# Patient Record
Sex: Female | Born: 1970 | Hispanic: No | Marital: Married | Smoking: Never smoker
Health system: Southern US, Community
[De-identification: ages and names within clinical notes are randomized; demographics above are authoritative.]

## PROBLEM LIST (undated history)

## (undated) ENCOUNTER — Inpatient Hospital Stay (HOSPITAL_COMMUNITY): Payer: Self-pay

## (undated) DIAGNOSIS — Z789 Other specified health status: Secondary | ICD-10-CM

## (undated) HISTORY — PX: NO PAST SURGERIES: SHX2092

---

## 2010-10-23 ENCOUNTER — Inpatient Hospital Stay (HOSPITAL_COMMUNITY)
Admission: AD | Admit: 2010-10-23 | Discharge: 2010-10-23 | Payer: Self-pay | Source: Home / Self Care | Attending: Obstetrics & Gynecology | Admitting: Obstetrics & Gynecology

## 2010-10-29 ENCOUNTER — Other Ambulatory Visit
Admission: RE | Admit: 2010-10-29 | Discharge: 2010-10-29 | Payer: Self-pay | Source: Home / Self Care | Admitting: Obstetrics and Gynecology

## 2010-10-29 ENCOUNTER — Encounter: Payer: Self-pay | Admitting: Obstetrics and Gynecology

## 2010-10-29 ENCOUNTER — Ambulatory Visit: Payer: Self-pay | Admitting: Physician Assistant

## 2010-10-29 LAB — CONVERTED CEMR LAB
Antibody Screen: NEGATIVE
Basophils Absolute: 0 10*3/uL (ref 0.0–0.1)
Basophils Relative: 0 % (ref 0–1)
Eosinophils Absolute: 0.3 10*3/uL (ref 0.0–0.7)
Eosinophils Relative: 4 % (ref 0–5)
HCT: 33.2 % — ABNORMAL LOW (ref 36.0–46.0)
HIV: NONREACTIVE
Hemoglobin: 11.3 g/dL — ABNORMAL LOW (ref 12.0–15.0)
Hepatitis B Surface Ag: NEGATIVE
Lymphocytes Relative: 12 % (ref 12–46)
Lymphs Abs: 0.8 10*3/uL (ref 0.7–4.0)
MCHC: 34 g/dL (ref 30.0–36.0)
MCV: 89.2 fL (ref 78.0–100.0)
Monocytes Absolute: 0.7 10*3/uL (ref 0.1–1.0)
Monocytes Relative: 10 % (ref 3–12)
Neutro Abs: 5.1 10*3/uL (ref 1.7–7.7)
Neutrophils Relative %: 74 % (ref 43–77)
Platelets: 277 10*3/uL (ref 150–400)
RBC: 3.72 M/uL — ABNORMAL LOW (ref 3.87–5.11)
RDW: 12.2 % (ref 11.5–15.5)
Rh Type: POSITIVE
Rubella: 253.3 intl units/mL — ABNORMAL HIGH
WBC: 6.9 10*3/uL (ref 4.0–10.5)

## 2010-11-12 ENCOUNTER — Ambulatory Visit
Admission: RE | Admit: 2010-11-12 | Discharge: 2010-11-12 | Payer: Self-pay | Source: Home / Self Care | Attending: Obstetrics and Gynecology | Admitting: Obstetrics and Gynecology

## 2010-11-12 LAB — POCT URINALYSIS DIPSTICK
Bilirubin Urine: NEGATIVE
Hemoglobin, Urine: NEGATIVE
Ketones, ur: NEGATIVE mg/dL
Nitrite: NEGATIVE
Protein, ur: NEGATIVE mg/dL
Specific Gravity, Urine: 1.02 (ref 1.005–1.030)
Urine Glucose, Fasting: NEGATIVE mg/dL
Urobilinogen, UA: 0.2 mg/dL (ref 0.0–1.0)
pH: 7.5 (ref 5.0–8.0)

## 2010-11-26 ENCOUNTER — Encounter: Payer: Self-pay | Admitting: Obstetrics & Gynecology

## 2010-11-26 ENCOUNTER — Ambulatory Visit
Admission: RE | Admit: 2010-11-26 | Discharge: 2010-11-26 | Payer: Self-pay | Source: Home / Self Care | Attending: Obstetrics and Gynecology | Admitting: Obstetrics and Gynecology

## 2010-12-01 LAB — POCT URINALYSIS DIPSTICK
Bilirubin Urine: NEGATIVE
Hgb urine dipstick: NEGATIVE
Ketones, ur: NEGATIVE mg/dL
Nitrite: NEGATIVE
Protein, ur: NEGATIVE mg/dL
Specific Gravity, Urine: 1.01 (ref 1.005–1.030)
Urine Glucose, Fasting: NEGATIVE mg/dL
Urobilinogen, UA: 0.2 mg/dL (ref 0.0–1.0)
pH: 6 (ref 5.0–8.0)

## 2010-12-17 ENCOUNTER — Other Ambulatory Visit: Payer: Self-pay

## 2010-12-17 DIAGNOSIS — O0930 Supervision of pregnancy with insufficient antenatal care, unspecified trimester: Secondary | ICD-10-CM

## 2010-12-17 LAB — POCT URINALYSIS DIPSTICK
Bilirubin Urine: NEGATIVE
Hgb urine dipstick: NEGATIVE
Ketones, ur: NEGATIVE mg/dL
Nitrite: NEGATIVE
Protein, ur: NEGATIVE mg/dL
Specific Gravity, Urine: 1.01 (ref 1.005–1.030)
Urine Glucose, Fasting: NEGATIVE mg/dL
Urobilinogen, UA: 0.2 mg/dL (ref 0.0–1.0)
pH: 7 (ref 5.0–8.0)

## 2010-12-31 ENCOUNTER — Encounter: Payer: Self-pay | Admitting: Physician Assistant

## 2010-12-31 ENCOUNTER — Other Ambulatory Visit: Payer: Self-pay

## 2010-12-31 DIAGNOSIS — O0930 Supervision of pregnancy with insufficient antenatal care, unspecified trimester: Secondary | ICD-10-CM

## 2010-12-31 LAB — POCT URINALYSIS DIPSTICK
Bilirubin Urine: NEGATIVE
Hgb urine dipstick: NEGATIVE
Ketones, ur: NEGATIVE mg/dL
Nitrite: NEGATIVE
Protein, ur: 30 mg/dL — AB
Specific Gravity, Urine: >=1.03 (ref 1.005–1.030)
Urine Glucose, Fasting: NEGATIVE mg/dL
Urobilinogen, UA: 0.2 mg/dL (ref 0.0–1.0)
pH: 6 (ref 5.0–8.0)

## 2011-01-01 ENCOUNTER — Encounter: Payer: Self-pay | Admitting: Physician Assistant

## 2011-01-07 ENCOUNTER — Other Ambulatory Visit: Payer: Self-pay

## 2011-01-07 ENCOUNTER — Encounter: Payer: Self-pay | Admitting: Obstetrics and Gynecology

## 2011-01-07 DIAGNOSIS — O0930 Supervision of pregnancy with insufficient antenatal care, unspecified trimester: Secondary | ICD-10-CM

## 2011-01-07 LAB — CONVERTED CEMR LAB
Chlamydia, Swab/Urine, PCR: NEGATIVE
GC Probe Amp, Urine: NEGATIVE

## 2011-01-07 LAB — POCT URINALYSIS DIPSTICK
Bilirubin Urine: NEGATIVE
Hgb urine dipstick: NEGATIVE
Ketones, ur: NEGATIVE mg/dL
Nitrite: NEGATIVE
Protein, ur: 30 mg/dL — AB
Specific Gravity, Urine: 1.025 (ref 1.005–1.030)
Urine Glucose, Fasting: NEGATIVE mg/dL
Urobilinogen, UA: 0.2 mg/dL (ref 0.0–1.0)
pH: 6 (ref 5.0–8.0)

## 2011-01-14 ENCOUNTER — Other Ambulatory Visit: Payer: Self-pay

## 2011-01-14 DIAGNOSIS — O0930 Supervision of pregnancy with insufficient antenatal care, unspecified trimester: Secondary | ICD-10-CM

## 2011-01-14 LAB — POCT URINALYSIS DIPSTICK
Bilirubin Urine: NEGATIVE
Glucose, UA: NEGATIVE mg/dL
Hgb urine dipstick: NEGATIVE
Ketones, ur: NEGATIVE mg/dL
Nitrite: NEGATIVE
Protein, ur: NEGATIVE mg/dL
Specific Gravity, Urine: 1.015 (ref 1.005–1.030)
Urobilinogen, UA: 0.2 mg/dL (ref 0.0–1.0)
pH: 7 (ref 5.0–8.0)

## 2011-01-19 LAB — CBC
HCT: 33.9 % — ABNORMAL LOW (ref 36.0–46.0)
Hemoglobin: 11.8 g/dL — ABNORMAL LOW (ref 12.0–15.0)
MCH: 30.6 pg (ref 26.0–34.0)
MCHC: 34.8 g/dL (ref 30.0–36.0)
MCV: 87.8 fL (ref 78.0–100.0)
Platelets: 271 10*3/uL (ref 150–400)
RBC: 3.86 MIL/uL — ABNORMAL LOW (ref 3.87–5.11)
RDW: 12.1 % (ref 11.5–15.5)
WBC: 7.2 10*3/uL (ref 4.0–10.5)

## 2011-01-19 LAB — URINALYSIS, ROUTINE W REFLEX MICROSCOPIC
Bilirubin Urine: NEGATIVE
Glucose, UA: NEGATIVE mg/dL
Hgb urine dipstick: NEGATIVE
Ketones, ur: NEGATIVE mg/dL
Leukocytes, UA: NEGATIVE
Nitrite: POSITIVE — AB
Protein, ur: NEGATIVE mg/dL
Specific Gravity, Urine: 1.01 (ref 1.005–1.030)
Urobilinogen, UA: 0.2 mg/dL (ref 0.0–1.0)
pH: 7 (ref 5.0–8.0)

## 2011-01-19 LAB — POCT URINALYSIS DIPSTICK
Bilirubin Urine: NEGATIVE
Glucose, UA: NEGATIVE mg/dL
Hgb urine dipstick: NEGATIVE
Ketones, ur: NEGATIVE mg/dL
Nitrite: NEGATIVE
Protein, ur: NEGATIVE mg/dL
Specific Gravity, Urine: 1.015 (ref 1.005–1.030)
Urobilinogen, UA: 0.2 mg/dL (ref 0.0–1.0)
pH: 7 (ref 5.0–8.0)

## 2011-01-19 LAB — URINE CULTURE
Colony Count: >=100000
Culture  Setup Time: 201112151612

## 2011-01-19 LAB — WET PREP, GENITAL
Clue Cells Wet Prep HPF POC: NONE SEEN
Trich, Wet Prep: NONE SEEN
Yeast Wet Prep HPF POC: NONE SEEN

## 2011-01-19 LAB — RPR: RPR Ser Ql: NONREACTIVE

## 2011-01-19 LAB — ABO/RH: ABO/RH(D): O POS

## 2011-01-19 LAB — URINE MICROSCOPIC-ADD ON

## 2011-01-19 LAB — GC/CHLAMYDIA PROBE AMP, GENITAL
Chlamydia, DNA Probe: NEGATIVE
GC Probe Amp, Genital: NEGATIVE

## 2011-01-21 ENCOUNTER — Other Ambulatory Visit: Payer: Self-pay

## 2011-01-21 DIAGNOSIS — O0930 Supervision of pregnancy with insufficient antenatal care, unspecified trimester: Secondary | ICD-10-CM

## 2011-01-21 LAB — POCT URINALYSIS DIPSTICK
Bilirubin Urine: NEGATIVE
Glucose, UA: NEGATIVE mg/dL
Hgb urine dipstick: NEGATIVE
Ketones, ur: NEGATIVE mg/dL
Nitrite: NEGATIVE
Protein, ur: NEGATIVE mg/dL
Specific Gravity, Urine: 1.02 (ref 1.005–1.030)
Urobilinogen, UA: 0.2 mg/dL (ref 0.0–1.0)
pH: 6.5 (ref 5.0–8.0)

## 2011-01-25 ENCOUNTER — Inpatient Hospital Stay (HOSPITAL_COMMUNITY)
Admission: AD | Admit: 2011-01-25 | Discharge: 2011-01-25 | Disposition: A | Discharge status: Home / Self Care | Payer: Medicaid Other | Source: Ambulatory Visit | Attending: Obstetrics & Gynecology | Admitting: Obstetrics & Gynecology

## 2011-01-25 DIAGNOSIS — O479 False labor, unspecified: Secondary | ICD-10-CM | POA: Insufficient documentation

## 2011-01-26 ENCOUNTER — Inpatient Hospital Stay (HOSPITAL_COMMUNITY)
Admission: AD | Admit: 2011-01-26 | Discharge: 2011-01-29 | DRG: 775 | Disposition: A | Discharge status: Home / Self Care | Payer: Medicaid Other | Source: Ambulatory Visit | Attending: Family Medicine | Admitting: Family Medicine

## 2011-01-26 DIAGNOSIS — O09529 Supervision of elderly multigravida, unspecified trimester: Secondary | ICD-10-CM | POA: Diagnosis present

## 2011-01-26 LAB — CBC
HCT: 39.5 % (ref 36.0–46.0)
Hemoglobin: 13.5 g/dL (ref 12.0–15.0)
MCH: 30.2 pg (ref 26.0–34.0)
MCHC: 34.2 g/dL (ref 30.0–36.0)
MCV: 88.4 fL (ref 78.0–100.0)
Platelets: 292 10*3/uL (ref 150–400)
RBC: 4.47 MIL/uL (ref 3.87–5.11)
RDW: 13.1 % (ref 11.5–15.5)
WBC: 11.8 10*3/uL — ABNORMAL HIGH (ref 4.0–10.5)

## 2011-01-27 DIAGNOSIS — O09529 Supervision of elderly multigravida, unspecified trimester: Secondary | ICD-10-CM

## 2011-01-27 LAB — COMPREHENSIVE METABOLIC PANEL
ALT: 27 U/L (ref 0–35)
AST: 30 U/L (ref 0–37)
Albumin: 2.7 g/dL — ABNORMAL LOW (ref 3.5–5.2)
Alkaline Phosphatase: 256 U/L — ABNORMAL HIGH (ref 39–117)
BUN: 8 mg/dL (ref 6–23)
CO2: 20 mEq/L (ref 19–32)
Calcium: 8.8 mg/dL (ref 8.4–10.5)
Chloride: 101 mEq/L (ref 96–112)
Creatinine, Ser: 0.64 mg/dL (ref 0.4–1.2)
GFR calc Af Amer: >60 mL/min (ref >60)
GFR calc non Af Amer: >60 mL/min (ref >60)
Glucose, Bld: 92 mg/dL (ref 70–99)
Potassium: 3.9 mEq/L (ref 3.5–5.1)
Sodium: 132 mEq/L — ABNORMAL LOW (ref 135–145)
Total Bilirubin: 1 mg/dL (ref 0.3–1.2)
Total Protein: 6.5 g/dL (ref 6.0–8.3)

## 2011-01-27 LAB — RPR: RPR Ser Ql: NONREACTIVE

## 2012-05-05 ENCOUNTER — Emergency Department (HOSPITAL_COMMUNITY)
Admission: EM | Admit: 2012-05-05 | Discharge: 2012-05-05 | Disposition: A | Discharge status: Home / Self Care | Payer: BC Managed Care – PPO | Source: Home / Self Care | Attending: Emergency Medicine | Admitting: Emergency Medicine

## 2012-05-05 ENCOUNTER — Encounter (HOSPITAL_COMMUNITY): Payer: Self-pay | Admitting: *Deleted

## 2012-05-05 ENCOUNTER — Inpatient Hospital Stay (HOSPITAL_COMMUNITY)
Admission: AD | Admit: 2012-05-05 | Discharge: 2012-05-06 | Disposition: A | Discharge status: Home / Self Care | Payer: BC Managed Care – PPO | Source: Ambulatory Visit | Attending: Family Medicine | Admitting: Family Medicine

## 2012-05-05 ENCOUNTER — Inpatient Hospital Stay (HOSPITAL_COMMUNITY): Payer: BC Managed Care – PPO

## 2012-05-05 ENCOUNTER — Encounter (HOSPITAL_COMMUNITY): Payer: Self-pay | Admitting: Emergency Medicine

## 2012-05-05 DIAGNOSIS — O2340 Unspecified infection of urinary tract in pregnancy, unspecified trimester: Secondary | ICD-10-CM

## 2012-05-05 DIAGNOSIS — O4690 Antepartum hemorrhage, unspecified, unspecified trimester: Secondary | ICD-10-CM

## 2012-05-05 DIAGNOSIS — O2390 Unspecified genitourinary tract infection in pregnancy, unspecified trimester: Secondary | ICD-10-CM | POA: Insufficient documentation

## 2012-05-05 DIAGNOSIS — O200 Threatened abortion: Secondary | ICD-10-CM | POA: Insufficient documentation

## 2012-05-05 DIAGNOSIS — O26849 Uterine size-date discrepancy, unspecified trimester: Secondary | ICD-10-CM | POA: Insufficient documentation

## 2012-05-05 DIAGNOSIS — N390 Urinary tract infection, site not specified: Secondary | ICD-10-CM | POA: Insufficient documentation

## 2012-05-05 HISTORY — DX: Other specified health status: Z78.9

## 2012-05-05 LAB — POCT URINALYSIS DIP (DEVICE)
Glucose, UA: 250 mg/dL — AB
Ketones, ur: 40 mg/dL — AB
Nitrite: POSITIVE — AB
Protein, ur: >=300 mg/dL — AB
Specific Gravity, Urine: 1.015 (ref 1.005–1.030)
Urobilinogen, UA: >=8 mg/dL (ref 0.0–1.0)
pH: 8.5 — ABNORMAL HIGH (ref 5.0–8.0)

## 2012-05-05 LAB — CBC
Hemoglobin: 11.2 g/dL — ABNORMAL LOW (ref 12.0–15.0)
MCHC: 34.3 g/dL (ref 30.0–36.0)
RDW: 12.4 % (ref 11.5–15.5)

## 2012-05-05 LAB — OB RESULTS CONSOLE GC/CHLAMYDIA
Chlamydia: NEGATIVE
Gonorrhea: NEGATIVE

## 2012-05-05 LAB — POCT PREGNANCY, URINE: Preg Test, Ur: POSITIVE — AB

## 2012-05-05 LAB — HCG, QUANTITATIVE, PREGNANCY: hCG, Beta Chain, Quant, S: 9348 m[IU]/mL — ABNORMAL HIGH (ref <5)

## 2012-05-05 NOTE — MAU Note (Signed)
Pt +UPT at Urgent Care started bleeding on Monday.  Bleeding heavier today and passing clots. Lower abd pain since Sunday.

## 2012-05-05 NOTE — Progress Notes (Signed)
Speculum exam done by Ivonne Andrew CNM small amount of  Bleeding noted and cervix 1cm long

## 2012-05-05 NOTE — MAU Note (Signed)
Pt states she has been bleeding since Sunday 

## 2012-05-05 NOTE — MAU Provider Note (Signed)
Erin Kpa41 y.o.G2P0 @[redacted]w[redacted]d  by LMP Chief Complaint  Patient presents with  . Vaginal Bleeding  . Abdominal Pain     First Provider Initiated Contact with Patient 05/05/12 2058      SUBJECTIVE  HPI: HPI: Erin Buckley is a 41 y.o. year old G66P0 female at [redacted]w[redacted]d weeks gestation by LMP who presents to MAU reporting vaginal bleeding with clots x 4 days and mild cramping. Denies passage of tissue. Seen at Urgent Care this evening and sent to MAU in private vehicle. Urinalysis was nitrate positive at urgent care. She states she has not had any ultrasounds in this pregnancy. Blood type O pos.  Past Medical History  Diagnosis Date  . No pertinent past medical history    Past Surgical History  Procedure Date  . No past surgeries    History   Social History  . Marital Status: Married    Spouse Name: N/A    Number of Children: N/A  . Years of Education: N/A   Occupational History  . Not on file.   Social History Main Topics  . Smoking status: Never Smoker   . Smokeless tobacco: Not on file  . Alcohol Use: No  . Drug Use: No  . Sexually Active:    Other Topics Concern  . Not on file   Social History Narrative  . No narrative on file   No current facility-administered medications on file prior to encounter.   No current outpatient prescriptions on file prior to encounter.   No Known Allergies  ROS: Pertinent items in HPI  OBJECTIVE Blood pressure 108/80, pulse 92, temperature 98.5 F (36.9 C), temperature source Oral, resp. rate 16, height 5\' 2"  (1.575 m), weight 52.527 kg (115 lb 12.8 oz), last menstrual period 02/23/2012.  GENERAL: Well-developed, slender Asian female in no acute distress.  HEENT: Normocephalic HEART: normal rate RESP: normal effort ABDOMEN: Soft, nontender EXTREMITIES: Nontender, no edema NEURO: Alert and oriented SPECULUM EXAM: NEFG, small amount of bright red blood noted, cervix clean BIMANUAL: cervix 1 cm and long; uterus slightly enlarged,  nontender; no adnexal tenderness or masses  LAB RESULTS  Results for orders placed during the hospital encounter of 05/05/12 (from the past 24 hour(s))  HCG, QUANTITATIVE, PREGNANCY     Status: Abnormal   Collection Time   05/05/12  8:58 PM      Component Value Range   hCG, Beta Chain, Quant, S 9348 (*) <5 mIU/mL  CBC     Status: Abnormal   Collection Time   05/05/12  8:58 PM      Component Value Range   WBC 8.2  4.0 - 10.5 K/uL   RBC 3.73 (*) 3.87 - 5.11 MIL/uL   Hemoglobin 11.2 (*) 12.0 - 15.0 g/dL   HCT 16.1 (*) 09.6 - 04.5 %   MCV 87.7  78.0 - 100.0 fL   MCH 30.0  26.0 - 34.0 pg   MCHC 34.3  30.0 - 36.0 g/dL   RDW 40.9  81.1 - 91.4 %   Platelets 283  150 - 400 K/uL  WET PREP, GENITAL     Status: Abnormal   Collection Time   05/05/12 11:50 PM      Component Value Range   Yeast Wet Prep HPF POC NONE SEEN  NONE SEEN   Trich, Wet Prep NONE SEEN  NONE SEEN   Clue Cells Wet Prep HPF POC NONE SEEN  NONE SEEN   WBC, Wet Prep HPF POC FEW (*) NONE SEEN  URINALYSIS, ROUTINE W REFLEX MICROSCOPIC     Status: Abnormal   Collection Time   05/05/12 11:50 PM      Component Value Range   Color, Urine YELLOW  YELLOW   APPearance CLEAR  CLEAR   Specific Gravity, Urine >1.030 (*) 1.005 - 1.030   pH 6.0  5.0 - 8.0   Glucose, UA NEGATIVE  NEGATIVE mg/dL   Hgb urine dipstick NEGATIVE  NEGATIVE   Bilirubin Urine NEGATIVE  NEGATIVE   Ketones, ur NEGATIVE  NEGATIVE mg/dL   Protein, ur NEGATIVE  NEGATIVE mg/dL   Urobilinogen, UA 0.2  0.0 - 1.0 mg/dL   Nitrite NEGATIVE  NEGATIVE   Leukocytes, UA NEGATIVE  NEGATIVE    IMAGING US Ob Comp Less 14 Wks  05/05/2012  *RADIOLOGY REPORT*  Clinical Data: Bleeding and pain.  Estimated gestational age by LMP is 10 weeks 2 days.  Quantitative beta HCG is 9348.  OBSTETRIC <14 WK [place] AND TRANSVAGINAL OB US  Technique:  Both transabdominal and transvaginal ultrasound examinations were performed for complete evaluation of the gestation as well as the maternal  uterus, adnexal regions, and pelvic cul-de-sac.  Transvaginal technique was performed to assess early pregnancy.  Comparison:  None.  Intrauterine gestational sac:  A single gestational sac is visualized.  The gestational sac is located in the vaginal region. Endometrial stripe thickness is normal and no intrauterine pregnancy is visualized.  Small amount of fluid in the lower uterine segment. Yolk sac: Present. Embryo: Present. Cardiac Activity: Not visualized. Heart Rate: N/A bpm  MSD: 12  mm CRL: 26   mm  5   w  6   d          [place] EDC: 12/30/2012  Maternal uterus/adnexae: The uterus is anteverted.  No focal myometrial masses. The right ovary measures 2.5 x 1.3 x 1.6 cm.  The left ovary measures 2 x 1.1 x 1.4 cm.  Normal follicular changes in both ovaries.  No abnormal adnexal masses.  Small amount of free fluid in the pelvis.  IMPRESSION: An intrauterine gestational sac, including fetal pole and yolk sac, are visualized in the vaginal region.  No fetal cardiac activity is visualized.  Estimated gestational age by crown-rump length is 5 weeks 6 days which is smaller than would be expected by reported last menstrual period.  Changes are consistent with ongoing spontaneous abortion.  Original Report Authenticated By: Erin Buckley, M.D.   US Ob Transvaginal  05/05/2012  *RADIOLOGY REPORT*  Clinical Data: Bleeding and pain.  Estimated gestational age by LMP is 10 weeks 2 days.  Quantitative beta HCG is 9348.  OBSTETRIC <14 WK [place] AND TRANSVAGINAL OB US  Technique:  Both transabdominal and transvaginal ultrasound examinations were performed for complete evaluation of the gestation as well as the maternal uterus, adnexal regions, and pelvic cul-de-sac.  Transvaginal technique was performed to assess early pregnancy.  Comparison:  None.  Intrauterine gestational sac:  A single gestational sac is visualized.  The gestational sac is located in the vaginal region. Endometrial stripe thickness is normal and no  intrauterine pregnancy is visualized.  Small amount of fluid in the lower uterine segment. Yolk sac: Present. Embryo: Present. Cardiac Activity: Not visualized. Heart Rate: N/A bpm  MSD: 12  mm CRL: 26   mm  5   w  6   d          [place] EDC: 12/30/2012  Maternal uterus/adnexae: The uterus is anteverted.  No focal myometrial masses.  The right ovary measures 2.5 x 1.3 x 1.6 cm.  The left ovary measures 2 x 1.1 x 1.4 cm.  Normal follicular changes in both ovaries.  No abnormal adnexal masses.  Small amount of free fluid in the pelvis.  IMPRESSION: An intrauterine gestational sac, including fetal pole and yolk sac, are visualized in the vaginal region.  No fetal cardiac activity is visualized.  Estimated gestational age by crown-rump length is 5 weeks 6 days which is smaller than would be expected by reported last menstrual period.  Changes are consistent with ongoing spontaneous abortion.  Original Report Authenticated By: Erin Buckley, M.D.   ASSESSMENT 1. UTI (urinary tract infection) during pregnancy   2. Threatened abortion   3. Uterine size date discrepancy pregnancy    PLAN D/C home SAB precautions Lengthy discussion via phone interpreter about the uncertainty of findings, but possible SAB in progress Repeat ultrasound in one week Urine culture pending Medication List  As of 05/06/2012  4:17 AM   TAKE these medications         nitrofurantoin (macrocrystal-monohydrate) 100 MG capsule   Commonly known as: MACROBID   Take 1 capsule (100 mg total) by mouth 2 (two) times daily.           Follow-up Information    Follow up with [facility] in 1 week. (or  as needed if symptoms worsen)    Contact information:   [address] [place] [address] [phone]        Dorathy Kinsman 05/05/2012 11:07 PM

## 2012-05-05 NOTE — ED Provider Notes (Addendum)
History     CSN: [id number]  Arrival date & time 05/05/12  1643   First MD Initiated Contact with Patient 05/05/12 1650      Chief Complaint  Patient presents with  . Vaginal Bleeding    (Consider location/radiation/quality/duration/timing/severity/associated sxs/prior treatment) HPI Comments: Patient presents urgent care today complaining of ongoing vaginal bleeding. Started spotting on Sunday but since Friday had been bleeding large amounts of blood. She describes her last normal. Was around May. She is sexually active and does not use any form of contraception. She describes a ongoing pain and discomfort in the lower abdominal region. Points mainly to right lower quadrant area. Patient denies any fevers, vomiting. Says the bleeding has been more severe today and she seem being clots of blood.  Patient is a 41 y.o. female presenting with vaginal bleeding. The history is provided by the patient.  Vaginal Bleeding This is a new problem. The current episode started more than 1 week ago. The problem occurs constantly. The problem has been gradually worsening. Associated symptoms include abdominal pain. Pertinent negatives include no headaches and no shortness of breath. Nothing relieves the symptoms. She has tried nothing for the symptoms.    History reviewed. No pertinent past medical history.  History reviewed. No pertinent past surgical history.  No family history on file.  History  Substance Use Topics  . Smoking status: Never Smoker   . Smokeless tobacco: Not on file  . Alcohol Use: No    OB History    Grav Para Term Preterm Abortions TAB SAB Ect Mult Living   1               Review of Systems  Constitutional: Positive for activity change. Negative for chills and appetite change.  Respiratory: Negative for shortness of breath.   Gastrointestinal: Positive for abdominal pain.  Genitourinary: Positive for vaginal bleeding and menstrual problem. Negative for vaginal  discharge and vaginal pain.  Neurological: Negative for headaches.    Allergies  Review of patient's allergies indicates no known allergies.  Home Medications  No current outpatient prescriptions on file.  BP 109/79  Pulse 85  Temp 98.4 F (36.9 C) (Oral)  Resp 18  SpO2 100%  LMP 02/23/2012  Physical Exam  Nursing note and vitals reviewed. Constitutional: Vital signs are normal. She appears well-developed and well-nourished.  Non-toxic appearance. She does not have a sickly appearance. She does not appear ill. No distress.  HENT:  Head: Normocephalic.  Eyes: Conjunctivae are normal. No scleral icterus.  Neck: Neck supple.  Cardiovascular: Normal rate.  Exam reveals no gallop and no friction rub.   No murmur heard. Pulmonary/Chest: Effort normal and breath sounds normal.  Abdominal: She exhibits no shifting dullness and no distension. There is no hepatosplenomegaly or hepatomegaly. There is no rebound, no guarding, no CVA tenderness, no tenderness at McBurney's point and negative Murphy's sign. No hernia. Hernia confirmed negative in the ventral area.    Genitourinary:     Musculoskeletal: Normal range of motion.  Neurological: She is alert.  Skin: Skin is warm.    ED Course  Procedures (including critical care time)  Labs Reviewed  POCT PREGNANCY, URINE - Abnormal; Notable for the following:    Preg Test, Ur POSITIVE (*)     All other components within normal limits  POCT URINALYSIS DIP (DEVICE) - Abnormal; Notable for the following:    Glucose, UA 250 (*)     Bilirubin Urine LARGE (*)     Ketones,  ur 40 (*)     Hgb urine dipstick LARGE (*)     pH 8.5 (*)     Protein, ur >=300 (*)     Nitrite POSITIVE (*)     Leukocytes, UA LARGE (*)  Biochemical Testing Only. Please order routine urinalysis from main lab if confirmatory testing is needed.   All other components within normal limits   No results found.   1. Vaginal bleeding in pregnancy       MDM    Vaginal bleeding with pelvic pain and pregnant. Based on LMP possibly 8-10 months. Have transfer patient to [facility] hospital had discussed case with nurse practitioner interpretation was used, patient and companion aware that they will be waiting for her in the women[facility] hospital at the MAU unit. Ectopic pregnancy versus active spontaneous abortion. Patient is hemodynamically stable, and is accompanied by her husband and we used a known friend as a Nurse, learning disability. They are preparing to go to women's hospital right now       Jimmie Molly, MD 05/05/12 1610  Jimmie Molly, MD 05/05/12 [phone]

## 2012-05-05 NOTE — ED Notes (Signed)
[facility] [employer] [facility] -Press [facility].  Report given.

## 2012-05-05 NOTE — ED Notes (Signed)
Started bleeding on Sunday, today describes large amounts of blood.  Last period in may. Now is the first time seeing physician

## 2012-05-06 DIAGNOSIS — O2390 Unspecified genitourinary tract infection in pregnancy, unspecified trimester: Secondary | ICD-10-CM

## 2012-05-06 DIAGNOSIS — N390 Urinary tract infection, site not specified: Secondary | ICD-10-CM

## 2012-05-06 LAB — URINALYSIS, ROUTINE W REFLEX MICROSCOPIC
Ketones, ur: NEGATIVE mg/dL
Leukocytes, UA: NEGATIVE
Nitrite: NEGATIVE
Protein, ur: NEGATIVE mg/dL

## 2012-05-06 LAB — WET PREP, GENITAL
Trich, Wet Prep: NONE SEEN
Yeast Wet Prep HPF POC: NONE SEEN

## 2012-05-06 MED ORDER — NITROFURANTOIN MONOHYD MACRO 100 MG PO CAPS: 100.0000 mg | ORAL_CAPSULE | Freq: Two times a day (BID) | ORAL | Status: AC

## 2012-05-06 NOTE — MAU Provider Note (Signed)
Chart reviewed and agree with management and plan.  

## 2012-05-07 LAB — URINE CULTURE: Colony Count: NO GROWTH

## 2012-05-11 ENCOUNTER — Ambulatory Visit (HOSPITAL_COMMUNITY)
Admission: RE | Admit: 2012-05-11 | Discharge: 2012-05-11 | Disposition: A | Discharge status: Home / Self Care | Payer: BC Managed Care – PPO | Source: Ambulatory Visit | Attending: Advanced Practice Midwife | Admitting: Advanced Practice Midwife

## 2012-05-11 DIAGNOSIS — Z3689 Encounter for other specified antenatal screening: Secondary | ICD-10-CM | POA: Insufficient documentation

## 2012-05-11 DIAGNOSIS — O200 Threatened abortion: Secondary | ICD-10-CM | POA: Insufficient documentation

## 2012-11-09 NOTE — L&D Delivery Note (Signed)
Delivery Note At 9:38 AM a viable female was delivered via Vaginal, Spontaneous Delivery (Presentation: Right Occiput Anterior).  There was a shoulder dystocia for approximately 1.5 minutes. Maneuvers tried include McRoberts, suprapubic pressure, woodscrew maneuver rotating shoulders  counterclockwise from AP to oblique, and delivery of the posterior (left) arm achieved by hooking axilla. Dr. Marice Potter arrived in the room immediately after delivery. Infant vigorous by 1 minute of age. Moving both arms symmetrically.    APGAR: 8, 9; weight 7 lb 15.7 oz (3620 g).   Placenta status: Intact, Spontaneous.  Cord: 3 vessels with the following complications: None.  Cord pH: n/a  Anesthesia: Local  Episiotomy: None Lacerations: 2nd degree;Perineal Suture Repair: 2.0 3.0 vicryl rapide Est. Blood Loss (mL): 350cc  Mom to postpartum.  Baby to nursery-stable. Placenta to birthing suites.  Levert Feinstein 02/14/2013, 10:39 AM  The resident performed the delivery of the head. When turtle was observed, I performed the entire delivery and assisted with the repair. I agree with above.  [place], [place] 02/14/2013 4:09 PM

## 2012-11-09 NOTE — L&D Delivery Note (Signed)
Attestation of Attending Supervision of Advanced Practitioner (CNM/NP): Evaluation and management procedures were performed by the Advanced Practitioner under my supervision and collaboration.  I have reviewed the Advanced Practitioner's note and chart, and I agree with the management and plan.  Kazuki Ingle 02/21/2013 8:35 AM

## 2012-11-17 ENCOUNTER — Encounter: Payer: Self-pay | Admitting: Family Medicine

## 2012-11-17 ENCOUNTER — Ambulatory Visit (INDEPENDENT_AMBULATORY_CARE_PROVIDER_SITE_OTHER): Payer: BC Managed Care – PPO | Admitting: Family Medicine

## 2012-11-17 VITALS — BP 95/54 | Temp 97.6°F | Ht 62.25 in | Wt 137.8 lb

## 2012-11-17 DIAGNOSIS — Z789 Other specified health status: Secondary | ICD-10-CM | POA: Insufficient documentation

## 2012-11-17 DIAGNOSIS — O09529 Supervision of elderly multigravida, unspecified trimester: Secondary | ICD-10-CM

## 2012-11-17 DIAGNOSIS — Z23 Encounter for immunization: Secondary | ICD-10-CM

## 2012-11-17 DIAGNOSIS — O09299 Supervision of pregnancy with other poor reproductive or obstetric history, unspecified trimester: Secondary | ICD-10-CM

## 2012-11-17 DIAGNOSIS — Z609 Problem related to social environment, unspecified: Secondary | ICD-10-CM

## 2012-11-17 DIAGNOSIS — Z3480 Encounter for supervision of other normal pregnancy, unspecified trimester: Secondary | ICD-10-CM | POA: Insufficient documentation

## 2012-11-17 DIAGNOSIS — Z758 Other problems related to medical facilities and other health care: Secondary | ICD-10-CM

## 2012-11-17 DIAGNOSIS — IMO0002 Reserved for concepts with insufficient information to code with codable children: Secondary | ICD-10-CM | POA: Insufficient documentation

## 2012-11-17 DIAGNOSIS — O0930 Supervision of pregnancy with insufficient antenatal care, unspecified trimester: Secondary | ICD-10-CM

## 2012-11-17 LAB — DIFFERENTIAL
Basophils Relative: 0 % (ref 0–1)
Eosinophils Absolute: 0.3 10*3/uL (ref 0.0–0.7)
Eosinophils Relative: 3 % (ref 0–5)
Monocytes Relative: 8 % (ref 3–12)
Neutrophils Relative %: 75 % (ref 43–77)

## 2012-11-17 LAB — CBC
Hemoglobin: 10.7 g/dL — ABNORMAL LOW (ref 12.0–15.0)
MCH: 30.4 pg (ref 26.0–34.0)
MCHC: 33.8 g/dL (ref 30.0–36.0)

## 2012-11-17 MED ORDER — TETANUS-DIPHTH-ACELL PERTUSSIS 5-2.5-18.5 LF-MCG/0.5 IM SUSP
0.5000 mL | Freq: Once | INTRAMUSCULAR | Status: AC
  Administered 2012-11-17: 0.5 mL via INTRAMUSCULAR

## 2012-11-17 MED ORDER — INFLUENZA VIRUS VACC SPLIT PF IM SUSP
0.5000 mL | Freq: Once | INTRAMUSCULAR | Status: AC
  Administered 2012-11-17: 0.5 mL via INTRAMUSCULAR

## 2012-11-17 NOTE — Patient Instructions (Signed)
Pregnancy - Third Trimester The third trimester of pregnancy (the last 3 months) is a period of the most rapid growth for you and your baby. The baby approaches a length of 20 inches and a weight of 6 to 10 pounds. The baby is adding on fat and getting ready for life outside your body. While inside, babies have periods of sleeping and waking, suck their thumbs, and hiccups. You can often feel small contractions of the uterus. This is false labor. It is also called Braxton-Hicks contractions. This is like a practice for labor. The usual problems in this stage of pregnancy include more difficulty breathing, swelling of the hands and feet from water retention, and having to urinate more often because of the uterus and baby pressing on your bladder.  PRENATAL EXAMS  Blood work may continue to be done during prenatal exams. These tests are done to check on your health and the probable health of your baby. Blood work is used to follow your blood levels (hemoglobin). Anemia (low hemoglobin) is common during pregnancy. Iron and vitamins are given to help prevent this. You may also continue to be checked for diabetes. Some of the past blood tests may be done again.  The size of the uterus is measured during each visit. This makes sure your baby is growing properly according to your pregnancy dates.  Your blood pressure is checked every prenatal visit. This is to make sure you are not getting toxemia.  Your urine is checked every prenatal visit for infection, diabetes and protein.  Your weight is checked at each visit. This is done to make sure gains are happening at the suggested rate and that you and your baby are growing normally.  Sometimes, an ultrasound is performed to confirm the position and the proper growth and development of the baby. This is a test done that bounces harmless sound waves off the baby so your caregiver can more accurately determine due dates.  Discuss the type of pain medication  and anesthesia you will have during your labor and delivery.  Discuss the possibility and anesthesia if a Cesarean Section might be necessary.  Inform your caregiver if there is any mental or physical violence at home. Sometimes, a specialized non-stress test, contraction stress test and biophysical profile are done to make sure the baby is not having a problem. Checking the amniotic fluid surrounding the baby is called an amniocentesis. The amniotic fluid is removed by sticking a needle into the belly (abdomen). This is sometimes done near the end of pregnancy if an early delivery is required. In this case, it is done to help make sure the baby's lungs are mature enough for the baby to live outside of the womb. If the lungs are not mature and it is unsafe to deliver the baby, an injection of cortisone medication is given to the mother 1 to 2 days before the delivery. This helps the baby's lungs mature and makes it safer to deliver the baby. CHANGES OCCURING IN THE THIRD TRIMESTER OF PREGNANCY Your body goes through many changes during pregnancy. They vary from person to person. Talk to your caregiver about changes you notice and are concerned about.  During the last trimester, you have probably had an increase in your appetite. It is normal to have cravings for certain foods. This varies from person to person and pregnancy to pregnancy.  You may begin to get stretch marks on your hips, abdomen, and breasts. These are normal changes in the body   during pregnancy. There are no exercises or medications to take which prevent this change.  Constipation may be treated with a stool softener or adding bulk to your diet. Drinking lots of fluids, fiber in vegetables, fruits, and whole grains are helpful.  Exercising is also helpful. If you have been very active up until your pregnancy, most of these activities can be continued during your pregnancy. If you have been less active, it is helpful to start an  exercise program such as walking. Consult your caregiver before starting exercise programs.  Avoid all smoking, alcohol, un-prescribed drugs, herbs and "street drugs" during your pregnancy. These chemicals affect the formation and growth of the baby. Avoid chemicals throughout the pregnancy to ensure the delivery of a healthy infant.  Backache, varicose veins and hemorrhoids may develop or get worse.  You will tire more easily in the third trimester, which is normal.  The baby's movements may be stronger and more often.  You may become short of breath easily.  Your belly button may stick out.  A yellow discharge may leak from your breasts called colostrum.  You may have a bloody mucus discharge. This usually occurs a few days to a week before labor begins. HOME CARE INSTRUCTIONS   Keep your caregiver's appointments. Follow your caregiver's instructions regarding medication use, exercise, and diet.  During pregnancy, you are providing food for you and your baby. Continue to eat regular, well-balanced meals. Choose foods such as meat, fish, milk and other low fat dairy products, vegetables, fruits, and whole-grain breads and cereals. Your caregiver will tell you of the ideal weight gain.  A physical sexual relationship may be continued throughout pregnancy if there are no other problems such as early (premature) leaking of amniotic fluid from the membranes, vaginal bleeding, or belly (abdominal) pain.  Exercise regularly if there are no restrictions. Check with your caregiver if you are unsure of the safety of your exercises. Greater weight gain will occur in the last 2 trimesters of pregnancy. Exercising helps:  Control your weight.  Get you in shape for labor and delivery.  You lose weight after you deliver.  Rest a lot with legs elevated, or as needed for leg cramps or low back pain.  Wear a good support or jogging bra for breast tenderness during pregnancy. This may help if worn  during sleep. Pads or tissues may be used in the bra if you are leaking colostrum.  Do not use hot tubs, steam rooms, or saunas.  Wear your seat belt when driving. This protects you and your baby if you are in an accident.  Avoid raw meat, cat litter boxes and soil used by cats. These carry germs that can cause birth defects in the baby.  It is easier to loose urine during pregnancy. Tightening up and strengthening the pelvic muscles will help with this problem. You can practice stopping your urination while you are going to the bathroom. These are the same muscles you need to strengthen. It is also the muscles you would use if you were trying to stop from passing gas. You can practice tightening these muscles up 10 times a set and repeating this about 3 times per day. Once you know what muscles to tighten up, do not perform these exercises during urination. It is more likely to cause an infection by backing up the urine.  Ask for help if you have financial, counseling or nutritional needs during pregnancy. Your caregiver will be able to offer counseling for these   needs as well as refer you for other special needs.  Make a list of emergency phone numbers and have them available.  Plan on getting help from family or friends when you go home from the hospital.  Make a trial run to the hospital.  Take prenatal classes with the father to understand, practice and ask questions about the labor and delivery.  Prepare the baby's room/nursery.  Do not travel out of the city unless it is absolutely necessary and with the advice of your caregiver.  Wear only low or no heal shoes to have better balance and prevent falling. MEDICATIONS AND DRUG USE IN PREGNANCY  Take prenatal vitamins as directed. The vitamin should contain 1 milligram of folic acid. Keep all vitamins out of reach of children. Only a couple vitamins or tablets containing iron may be fatal to a baby or young child when  ingested.  Avoid use of all medications, including herbs, over-the-counter medications, not prescribed or suggested by your caregiver. Only take over-the-counter or prescription medicines for pain, discomfort, or fever as directed by your caregiver. Do not use aspirin, ibuprofen (Motrin, Advil, Nuprin) or naproxen (Aleve) unless OK'd by your caregiver.  Let your caregiver also know about herbs you may be using.  Alcohol is related to a number of birth defects. This includes fetal alcohol syndrome. All alcohol, in any form, should be avoided completely. Smoking will cause low birth rate and premature babies.  Street/illegal drugs are very harmful to the baby. They are absolutely forbidden. A baby born to an addicted mother will be addicted at birth. The baby will go through the same withdrawal an adult does. SEEK MEDICAL CARE IF: You have any concerns or worries during your pregnancy. It is better to call with your questions if you feel they cannot wait, rather than worry about them. DECISIONS ABOUT CIRCUMCISION You may or may not know the sex of your baby. If you know your baby is a boy, it may be time to think about circumcision. Circumcision is the removal of the foreskin of the penis. This is the skin that covers the sensitive end of the penis. There is no proven medical need for this. Often this decision is made on what is popular at the time or based upon religious beliefs and social issues. You can discuss these issues with your caregiver or pediatrician. SEEK IMMEDIATE MEDICAL CARE IF:   An unexplained oral temperature above 102 F (38.9 C) develops, or as your caregiver suggests.  You have leaking of fluid from the vagina (birth canal). If leaking membranes are suspected, take your temperature and tell your caregiver of this when you call.  There is vaginal spotting, bleeding or passing clots. Tell your caregiver of the amount and how many pads are used.  You develop a bad smelling  vaginal discharge with a change in the color from clear to white.  You develop vomiting that lasts more than 24 hours.  You develop chills or fever.  You develop shortness of breath.  You develop burning on urination.  You loose more than 2 pounds of weight or gain more than 2 pounds of weight or as suggested by your caregiver.  You notice sudden swelling of your face, hands, and feet or legs.  You develop belly (abdominal) pain. Round ligament discomfort is a common non-cancerous (benign) cause of abdominal pain in pregnancy. Your caregiver still must evaluate you.  You develop a severe headache that does not go away.  You develop visual   problems, blurred or double vision.  If you have not felt your baby move for more than 1 hour. If you think the baby is not moving as much as usual, eat something with sugar in it and lie down on your left side for an hour. The baby should move at least 4 to 5 times per hour. Call right away if your baby moves less than that.  You fall, are in a car accident or any kind of trauma.  There is mental or physical violence at home. Document Released: 10/20/2001 Document Revised: 01/18/2012 Document Reviewed: 04/24/2009 [employer] Patient Information 2013 [employer].  Breastfeeding Deciding to breastfeed is one of the best choices you can make for you and your baby. The information that follows gives a brief overview of the benefits of breastfeeding as well as common topics surrounding breastfeeding. BENEFITS OF BREASTFEEDING For the baby  The first milk (colostrum) helps the baby's digestive system function better.   There are antibodies in the mother's milk that help the baby fight off infections.   The baby has a lower incidence of asthma, allergies, and sudden infant death syndrome (SIDS).   The nutrients in breast milk are better for the baby than infant formulas, and breast milk helps the baby's brain grow better.   Babies who  breastfeed have less gas, colic, and constipation.  For the mother  Breastfeeding helps develop a very special bond between the mother and her baby.   Breastfeeding is convenient, always available at the correct temperature, and costs nothing.   Breastfeeding burns calories in the mother and helps her lose weight that was gained during pregnancy.   Breastfeeding makes the uterus contract back down to normal size faster and slows bleeding following delivery.   Breastfeeding mothers have a lower risk of developing breast cancer.  BREASTFEEDING FREQUENCY  A healthy, full-term baby may breastfeed as often as every hour or space his or her feedings to every 3 hours.   Watch your baby for signs of hunger. Nurse your baby if he or she shows signs of hunger. How often you nurse will vary from baby to baby.   Nurse as often as the baby requests, or when you feel the need to reduce the fullness of your breasts.   Awaken the baby if it has been 3 4 hours since the last feeding.   Frequent feeding will help the mother make more milk and will help prevent problems, such as sore nipples and engorgement of the breasts.  BABY'S POSITION AT THE BREAST  Whether lying down or sitting, be sure that the baby's tummy is facing your tummy.   Support the breast with 4 fingers underneath the breast and the thumb above. Make sure your fingers are well away from the nipple and baby's mouth.   Stroke the baby's lips gently with your finger or nipple.   When the baby's mouth is open wide enough, place all of your nipple and as much of the areola as possible into your baby's mouth.   Pull the baby in close so the tip of the nose and the baby's cheeks touch the breast during the feeding.  FEEDINGS AND SUCTION  The length of each feeding varies from baby to baby and from feeding to feeding.   The baby must suck about 2 3 minutes for your milk to get to him or her. This is called a "let down."  For this reason, allow the baby to feed on each breast as   long as he or she wants. Your baby will end the feeding when he or she has received the right balance of nutrients.   To break the suction, put your finger into the corner of the baby's mouth and slide it between his or her gums before removing your breast from his or her mouth. This will help prevent sore nipples.  HOW TO TELL WHETHER YOUR BABY IS GETTING ENOUGH BREAST MILK. Wondering whether or not your baby is getting enough milk is a common concern among mothers. You can be assured that your baby is getting enough milk if:   Your baby is actively sucking and you hear swallowing.   Your baby seems relaxed and satisfied after a feeding.   Your baby nurses at least 8 12 times in a 24 hour time period. Nurse your baby until he or she unlatches or falls asleep at the first breast (at least 10 20 minutes), then offer the second side.   Your baby is wetting 5 6 disposable diapers (6 8 cloth diapers) in a 24 hour period by 5 6 days of age.   Your baby is having at least 3 4 stools every 24 hours for the first 6 weeks. The stool should be soft and yellow.   Your baby should gain 4 7 ounces per week after he or she is 4 days old.   Your breasts feel softer after nursing.  REDUCING BREAST ENGORGEMENT  In the first week after your baby is born, you may experience signs of breast engorgement. When breasts are engorged, they feel heavy, warm, full, and may be tender to the touch. You can reduce engorgement if you:   Nurse frequently, every 2 3 hours. Mothers who breastfeed early and often have fewer problems with engorgement.   Place light ice packs on your breasts for 10 20 minutes between feedings. This reduces swelling. Wrap the ice packs in a lightweight towel to protect your skin. Bags of frozen vegetables work well for this purpose.   Take a warm shower or apply warm, moist heat to your breast for 5 10 minutes just before  each feeding. This increases circulation and helps the milk flow.   Gently massage your breast before and during the feeding. Using your finger tips, massage from the chest wall towards your nipple in a circular motion.   Make sure that the baby empties at least one breast at every feeding before switching sides.   Use a breast pump to empty the breasts if your baby is sleepy or not nursing well. You may also want to pump if you are returning to work oryou feel you are getting engorged.   Avoid bottle feeds, pacifiers, or supplemental feedings of water or juice in place of breastfeeding. Breast milk is all the food your baby needs. It is not necessary for your baby to have water or formula. In fact, to help your breasts make more milk, it is best not to give your baby supplemental feedings during the early weeks.   Be sure the baby is latched on and positioned properly while breastfeeding.   Wear a supportive bra, avoiding underwire styles.   Eat a balanced diet with enough fluids.   Rest often, relax, and take your prenatal vitamins to prevent fatigue, stress, and anemia.  If you follow these suggestions, your engorgement should improve in 24 48 hours. If you are still experiencing difficulty, call your lactation consultant or caregiver.  CARING FOR YOURSELF Take care of your   breasts  Bathe or shower daily.   Avoid using soap on your nipples.   Start feedings on your left breast at one feeding and on your right breast at the next feeding.   You will notice an increase in your milk supply 2 5 days after delivery. You may feel some discomfort from engorgement, which makes your breasts very firm and often tender. Engorgement "peaks" out within 24 48 hours. In the meantime, apply warm moist towels to your breasts for 5 10 minutes before feeding. Gentle massage and expression of some milk before feeding will soften your breasts, making it easier for your baby to latch on.    Wear a well-fitting nursing bra, and air dry your nipples for a 3 4minutes after each feeding.   Only use cotton bra pads.   Only use pure lanolin on your nipples after nursing. You do not need to wash it off before feeding the baby again. Another option is to express a few drops of breast milk and gently massage it into your nipples.  Take care of yourself  Eat well-balanced meals and nutritious snacks.   Drinking milk, fruit juice, and water to satisfy your thirst (about 8 glasses a day).   Get plenty of rest.  Avoid foods that you notice affect the baby in a bad way.  SEEK MEDICAL CARE IF:   You have difficulty with breastfeeding and need help.   You have a hard, red, sore area on your breast that is accompanied by a fever.   Your baby is too sleepy to eat well or is having trouble sleeping.   Your baby is wetting less than 6 diapers a day, by 5 days of age.   Your baby's skin or white part of his or her eyes is more yellow than it was in the hospital.   You feel depressed.  Document Released: 10/26/2005 Document Revised: 04/26/2012 Document Reviewed: 01/24/2012 [employer] Patient Information 2013 [employer].  

## 2012-11-17 NOTE — Progress Notes (Signed)
Nutrition note: 1st visit consult Pt has gained 17.8# @ [redacted]w[redacted]d, which is wnl. Pt reports eating 3-4x/d and drinks water, juice & milk daily. Pt reports taking PNV. Pt reports no N&V or heartburn but has occ constipation. Pt received verbal (via a Nurse, learning disability) & written education on general nutrition during pregnancy. Disc tips to decrease constipation. Disc wt gain goals of 25-35# total or 1#/ wk. Pt agrees to continue PNV & consume a protein source with all meals & snacks. Pt receives [facility] & plans to BF. F/u if referred Blondell Reveal, MS, RD, LDN

## 2012-11-17 NOTE — Progress Notes (Signed)
   Subjective:    Erin Buckley is a G3P1010 [redacted]w[redacted]d being seen today for her first obstetrical visit.  Her obstetrical history is significant for advanced maternal age and late to care. Patient does intend to breast feed. Pregnancy history fully reviewed.  Pt. Had h/o SAB in 7/13 with no cycle since that time.  GA would be around 28 wks.  This is consistent with size.  Has h/o 4th degree with VAVD last delivery.  Patient reports no complaints.  Filed Vitals:   11/17/12 0825  BP: 95/54  Temp: 97.6 F (36.4 C)  Weight: 137 lb 12.8 oz (62.506 kg)    HISTORY: OB History    Grav Para Term Preterm Abortions TAB SAB Ect Mult Living   3 1 1  1  1         # Outc Date GA Lbr Len/2nd Wgt Sex Del Anes PTL Lv   1 TRM 3/12 [redacted]w[redacted]d   F VAC None No    Comments: 4th degree   2 SAB 6/13 [redacted]w[redacted]d          Comments: System Generated. Please review and update pregnancy details.   3 CUR              Past Medical History  Diagnosis Date  . No pertinent past medical history    Past Surgical History  Procedure Date  . No past surgeries    History reviewed. No pertinent family history.   Exam    Uterus:     Pelvic Exam:    Perineum: Normal Perineum   Vulva: Bartholin's, Urethra, Skene's normal   Vagina:  normal mucosa       Cervix: multiparous appearance   Adnexa: not evaluated   Bony Pelvis: average  System:     Skin: normal coloration and turgor, no rashes    Neurologic: normal   Extremities: normal strength, tone, and muscle mass   HEENT sclera clear, anicteric   Mouth/Teeth mucous membranes moist, pharynx normal without lesions   Neck supple   Cardiovascular: regular rate and rhythm   Respiratory:  appears well, vitals normal, no respiratory distress, acyanotic, normal RR, ear and throat exam is normal, neck free of mass or lymphadenopathy, chest clear, no wheezing, crepitations, rhonchi, normal symmetric air entry   Abdomen: soft, non-tender; bowel sounds normal; no masses,  no  organomegaly          Assessment:    Pregnancy: G3P1010 Patient Active Problem List  Diagnosis  . Advanced maternal age, antepartum  . Supervision of other normal pregnancy  . Late prenatal care        Plan:     Initial labs drawn. Prenatal vitamins. Problem list reviewed and updated. Genetic Screening discussed too late  Ultrasound discussed; fetal survey: ordered.  Follow up in 2 weeks. New OB and 28 wk labs TDAP, flu   Eliani Leclere S 11/17/2012

## 2012-11-17 NOTE — Progress Notes (Signed)
U/S and genetic counseling scheduled 11/24/12 at 9 am with MFM.

## 2012-11-17 NOTE — Progress Notes (Signed)
P = 92 

## 2012-11-18 ENCOUNTER — Encounter: Payer: Self-pay | Admitting: *Deleted

## 2012-11-18 LAB — HEPATITIS B SURFACE ANTIGEN: Hepatitis B Surface Ag: NEGATIVE

## 2012-11-18 LAB — RUBELLA SCREEN: Rubella: 14.7 Index — ABNORMAL HIGH (ref <0.90)

## 2012-11-18 LAB — GLUCOSE TOLERANCE, 1 HOUR (50G) W/O FASTING: Glucose, 1 Hour GTT: 118 mg/dL (ref 70–140)

## 2012-11-19 LAB — CULTURE, OB URINE: Colony Count: >=100000

## 2012-11-21 LAB — POCT URINALYSIS DIP (DEVICE)
Glucose, UA: NEGATIVE mg/dL
Hgb urine dipstick: NEGATIVE
Nitrite: NEGATIVE
Protein, ur: NEGATIVE mg/dL
Urobilinogen, UA: 0.2 mg/dL (ref 0.0–1.0)
pH: 6.5 (ref 5.0–8.0)

## 2012-11-22 ENCOUNTER — Encounter: Payer: Self-pay | Admitting: *Deleted

## 2012-11-24 ENCOUNTER — Encounter: Payer: Self-pay | Admitting: Family Medicine

## 2012-11-24 ENCOUNTER — Ambulatory Visit (HOSPITAL_COMMUNITY)
Admission: RE | Admit: 2012-11-24 | Discharge: 2012-11-24 | Disposition: A | Discharge status: Home / Self Care | Payer: BC Managed Care – PPO | Source: Ambulatory Visit | Attending: Family Medicine | Admitting: Family Medicine

## 2012-11-24 VITALS — BP 115/67 | HR 90 | Wt 144.0 lb

## 2012-11-24 DIAGNOSIS — Z1389 Encounter for screening for other disorder: Secondary | ICD-10-CM | POA: Insufficient documentation

## 2012-11-24 DIAGNOSIS — O0930 Supervision of pregnancy with insufficient antenatal care, unspecified trimester: Secondary | ICD-10-CM

## 2012-11-24 DIAGNOSIS — O09299 Supervision of pregnancy with other poor reproductive or obstetric history, unspecified trimester: Secondary | ICD-10-CM

## 2012-11-24 DIAGNOSIS — IMO0002 Reserved for concepts with insufficient information to code with codable children: Secondary | ICD-10-CM

## 2012-11-24 DIAGNOSIS — Z363 Encounter for antenatal screening for malformations: Secondary | ICD-10-CM | POA: Insufficient documentation

## 2012-11-24 DIAGNOSIS — Z3480 Encounter for supervision of other normal pregnancy, unspecified trimester: Secondary | ICD-10-CM

## 2012-11-24 DIAGNOSIS — O09529 Supervision of elderly multigravida, unspecified trimester: Secondary | ICD-10-CM | POA: Insufficient documentation

## 2012-11-24 DIAGNOSIS — O358XX0 Maternal care for other (suspected) fetal abnormality and damage, not applicable or unspecified: Secondary | ICD-10-CM | POA: Insufficient documentation

## 2012-11-24 NOTE — Progress Notes (Signed)
Calandria Sheek  was seen today for an ultrasound appointment.  See full report in [facility].  Comments: Ms. Ledee was seen today due to advanced maternal age.  Quad screen was not performed due to late prenatal care.  See separate note from [facility].  Impression: Single IUP at 27 6/7 weeks Normal fetal anatomic survey Fetal growth is appropriate (33rd %tile) No markers associated with Down syndrome Normal amniotic fluid volume  Recommendations: Follow-up ultrasounds as clinically indicated.   Alpha Gula, MD

## 2012-11-24 NOTE — Progress Notes (Signed)
Genetic Counseling  High-Risk Gestation Note  Appointment Date:  11/24/2012 Referred By: Reva Bores, MD Date of Birth:  Dec 31, 1970    Pregnancy History: [id number] Estimated Date of Delivery: 02/10/13 Estimated Gestational Age: [redacted]w[redacted]d Attending: Alpha Gula, MD   Ms. Erin Buckley was seen for genetic counseling because of a maternal age of 42 y.o..  Language Resources interpreter, Grover Canavan provided interpretation for today's visit.    She was counseled regarding maternal age and the association with risk for chromosome conditions due to nondisjunction with aging of the ova.   We reviewed chromosomes, nondisjunction, and the associated 1 in 44 risk for fetal aneuploidy at [redacted]w[redacted]d gestation related to a maternal age of 42 y.o. at delivery.  She was counseled that the risk for aneuploidy decreases as gestational age increases, accounting for those pregnancies which spontaneously abort.  We specifically discussed Down syndrome (trisomy 21), trisomies 13 and 40, including the common features and prognoses of each.   We reviewed other available screening options including noninvasive prenatal testing (NIPT), and detailed ultrasound. We discussed that screening tests are used to modify a patient's a priori risk for aneuploidy, typically based on age.  This estimate provides a pregnancy specific risk assessment.  Specifically, we discussed that NIPT analyzes cell free fetal DNA found in the maternal circulation. This test is not diagnostic for chromosome conditions, but can provide information regarding the presence or absence of extra fetal DNA for chromosomes 13, 18, 21, X, and Y, and missing fetal DNA for chromosome X and Y (Turner syndrome). Thus, it would not identify or rule out all genetic conditions. The reported detection rate is greater than 99% for Trisomy 21, greater than 98% for Trisomy 18, and is approximately 80% (8 out of 10) for Trisomy 13. The false positive rate is reported to be less than 0.1%  for any of these conditions.  In addition, we discussed that ~50-80% of fetuses with Down syndrome and up to 90-95% of fetuses with trisomy 18/13, when well visualized, have detectable anomalies or soft markers by detailed ultrasound (~18+ weeks gestation).   She was also counseled regarding diagnostic testing via amniocentesis.  We reviewed the approximate 1 in 300-500 risk for complications for amniocentesis, including spontaneous preterm labor at the current gestational age. We discussed the risks, limitations, and benefits of each screening and testing option. After consideration of all the options, the patient elected to proceed with targeted ultrasound only and declined NIPT and amniocentesis.  The ultrasound report will be sent under separate cover. We discussed that ultrasound cannot rule out all birth defects or genetic syndromes.   Both family histories were reviewed and found to be noncontributory for birth defects, mental retardation, and known genetic conditions. Without further information regarding the provided family history, an accurate genetic risk cannot be calculated. Further genetic counseling is warranted if more information is obtained.  Ms. Erin Buckley  denied exposure to environmental toxins or chemical agents. She denied the use of alcohol, tobacco or street drugs. She denied significant viral illnesses during the course of her pregnancy. Her medical and surgical histories were noncontributory.   I counseled Ms. Erin Buckley regarding the above risks and available options.  The approximate face-to-face time with the genetic counselor was 30 minutes.  Quinn Plowman, MS,  Certified Genetic Counselor 11/24/2012

## 2012-12-01 ENCOUNTER — Ambulatory Visit (INDEPENDENT_AMBULATORY_CARE_PROVIDER_SITE_OTHER): Payer: BC Managed Care – PPO | Admitting: Family Medicine

## 2012-12-01 VITALS — BP 115/62 | Temp 96.7°F | Wt 141.2 lb

## 2012-12-01 DIAGNOSIS — O09529 Supervision of elderly multigravida, unspecified trimester: Secondary | ICD-10-CM

## 2012-12-01 DIAGNOSIS — IMO0002 Reserved for concepts with insufficient information to code with codable children: Secondary | ICD-10-CM

## 2012-12-01 DIAGNOSIS — O0930 Supervision of pregnancy with insufficient antenatal care, unspecified trimester: Secondary | ICD-10-CM

## 2012-12-01 LAB — POCT URINALYSIS DIP (DEVICE)
Hgb urine dipstick: NEGATIVE
Ketones, ur: NEGATIVE mg/dL
Protein, ur: NEGATIVE mg/dL
Specific Gravity, Urine: 1.015 (ref 1.005–1.030)
Urobilinogen, UA: 0.2 mg/dL (ref 0.0–1.0)
pH: 7 (ref 5.0–8.0)

## 2012-12-01 NOTE — Patient Instructions (Signed)
Breastfeeding Deciding to breastfeed is one of the best choices you can make for you and your baby. The information that follows gives a brief overview of the benefits of breastfeeding as well as common topics surrounding breastfeeding. BENEFITS OF BREASTFEEDING For the baby  The first milk (colostrum) helps the baby's digestive system function better.   There are antibodies in the mother's milk that help the baby fight off infections.   The baby has a lower incidence of asthma, allergies, and sudden infant death syndrome (SIDS).   The nutrients in breast milk are better for the baby than infant formulas, and breast milk helps the baby's brain grow better.   Babies who breastfeed have less gas, colic, and constipation.  For the mother  Breastfeeding helps develop a very special bond between the mother and her baby.   Breastfeeding is convenient, always available at the correct temperature, and costs nothing.   Breastfeeding burns calories in the mother and helps her lose weight that was gained during pregnancy.   Breastfeeding makes the uterus contract back down to normal size faster and slows bleeding following delivery.   Breastfeeding mothers have a lower risk of developing breast cancer.  BREASTFEEDING FREQUENCY  A healthy, full-term baby may breastfeed as often as every hour or space his or her feedings to every 3 hours.   Watch your baby for signs of hunger. Nurse your baby if he or she shows signs of hunger. How often you nurse will vary from baby to baby.   Nurse as often as the baby requests, or when you feel the need to reduce the fullness of your breasts.   Awaken the baby if it has been 3 4 hours since the last feeding.   Frequent feeding will help the mother make more milk and will help prevent problems, such as sore nipples and engorgement of the breasts.  BABY'S POSITION AT THE BREAST  Whether lying down or sitting, be sure that the baby's tummy is  facing your tummy.   Support the breast with 4 fingers underneath the breast and the thumb above. Make sure your fingers are well away from the nipple and baby's mouth.   Stroke the baby's lips gently with your finger or nipple.   When the baby's mouth is open wide enough, place all of your nipple and as much of the areola as possible into your baby's mouth.   Pull the baby in close so the tip of the nose and the baby's cheeks touch the breast during the feeding.  FEEDINGS AND SUCTION  The length of each feeding varies from baby to baby and from feeding to feeding.   The baby must suck about 2 3 minutes for your milk to get to him or her. This is called a "let down." For this reason, allow the baby to feed on each breast as long as he or she wants. Your baby will end the feeding when he or she has received the right balance of nutrients.   To break the suction, put your finger into the corner of the baby's mouth and slide it between his or her gums before removing your breast from his or her mouth. This will help prevent sore nipples.  HOW TO TELL WHETHER YOUR BABY IS GETTING ENOUGH BREAST MILK. Wondering whether or not your baby is getting enough milk is a common concern among mothers. You can be assured that your baby is getting enough milk if:   Your baby is actively   sucking and you hear swallowing.   Your baby seems relaxed and satisfied after a feeding.   Your baby nurses at least 8 12 times in a 24 hour time period. Nurse your baby until he or she unlatches or falls asleep at the first breast (at least 10 20 minutes), then offer the second side.   Your baby is wetting 5 6 disposable diapers (6 8 cloth diapers) in a 24 hour period by 5 6 days of age.   Your baby is having at least 3 4 stools every 24 hours for the first 6 weeks. The stool should be soft and yellow.   Your baby should gain 4 7 ounces per week after he or she is 4 days old.   Your breasts feel softer  after nursing.  REDUCING BREAST ENGORGEMENT  In the first week after your baby is born, you may experience signs of breast engorgement. When breasts are engorged, they feel heavy, warm, full, and may be tender to the touch. You can reduce engorgement if you:   Nurse frequently, every 2 3 hours. Mothers who breastfeed early and often have fewer problems with engorgement.   Place light ice packs on your breasts for 10 20 minutes between feedings. This reduces swelling. Wrap the ice packs in a lightweight towel to protect your skin. Bags of frozen vegetables work well for this purpose.   Take a warm shower or apply warm, moist heat to your breast for 5 10 minutes just before each feeding. This increases circulation and helps the milk flow.   Gently massage your breast before and during the feeding. Using your finger tips, massage from the chest wall towards your nipple in a circular motion.   Make sure that the baby empties at least one breast at every feeding before switching sides.   Use a breast pump to empty the breasts if your baby is sleepy or not nursing well. You may also want to pump if you are returning to work oryou feel you are getting engorged.   Avoid bottle feeds, pacifiers, or supplemental feedings of water or juice in place of breastfeeding. Breast milk is all the food your baby needs. It is not necessary for your baby to have water or formula. In fact, to help your breasts make more milk, it is best not to give your baby supplemental feedings during the early weeks.   Be sure the baby is latched on and positioned properly while breastfeeding.   Wear a supportive bra, avoiding underwire styles.   Eat a balanced diet with enough fluids.   Rest often, relax, and take your prenatal vitamins to prevent fatigue, stress, and anemia.  If you follow these suggestions, your engorgement should improve in 24 48 hours. If you are still experiencing difficulty, call your  lactation consultant or caregiver.  CARING FOR YOURSELF Take care of your breasts  Bathe or shower daily.   Avoid using soap on your nipples.   Start feedings on your left breast at one feeding and on your right breast at the next feeding.   You will notice an increase in your milk supply 2 5 days after delivery. You may feel some discomfort from engorgement, which makes your breasts very firm and often tender. Engorgement "peaks" out within 24 48 hours. In the meantime, apply warm moist towels to your breasts for 5 10 minutes before feeding. Gentle massage and expression of some milk before feeding will soften your breasts, making it easier for your   baby to latch on.   Wear a well-fitting nursing bra, and air dry your nipples for a 3 4minutes after each feeding.   Only use cotton bra pads.   Only use pure lanolin on your nipples after nursing. You do not need to wash it off before feeding the baby again. Another option is to express a few drops of breast milk and gently massage it into your nipples.  Take care of yourself  Eat well-balanced meals and nutritious snacks.   Drinking milk, fruit juice, and water to satisfy your thirst (about 8 glasses a day).   Get plenty of rest.  Avoid foods that you notice affect the baby in a bad way.  SEEK MEDICAL CARE IF:   You have difficulty with breastfeeding and need help.   You have a hard, red, sore area on your breast that is accompanied by a fever.   Your baby is too sleepy to eat well or is having trouble sleeping.   Your baby is wetting less than 6 diapers a day, by 5 days of age.   Your baby's skin or white part of his or her eyes is more yellow than it was in the hospital.   You feel depressed.  Document Released: 10/26/2005 Document Revised: 04/26/2012 Document Reviewed: 01/24/2012 [employer] Patient Information 2013 [employer].  

## 2012-12-01 NOTE — Assessment & Plan Note (Signed)
Missed quad

## 2012-12-01 NOTE — Progress Notes (Signed)
Pulse: 92

## 2012-12-01 NOTE — Progress Notes (Signed)
Declined amnio--Nml anatomy

## 2012-12-14 ENCOUNTER — Ambulatory Visit (INDEPENDENT_AMBULATORY_CARE_PROVIDER_SITE_OTHER): Payer: BC Managed Care – PPO | Admitting: Obstetrics and Gynecology

## 2012-12-14 ENCOUNTER — Encounter: Payer: Self-pay | Admitting: Advanced Practice Midwife

## 2012-12-14 VITALS — BP 119/66 | Temp 98.0°F | Wt 145.1 lb

## 2012-12-14 DIAGNOSIS — Z789 Other specified health status: Secondary | ICD-10-CM

## 2012-12-14 DIAGNOSIS — O09299 Supervision of pregnancy with other poor reproductive or obstetric history, unspecified trimester: Secondary | ICD-10-CM

## 2012-12-14 DIAGNOSIS — Z3480 Encounter for supervision of other normal pregnancy, unspecified trimester: Secondary | ICD-10-CM

## 2012-12-14 DIAGNOSIS — Z603 Acculturation difficulty: Secondary | ICD-10-CM

## 2012-12-14 DIAGNOSIS — IMO0002 Reserved for concepts with insufficient information to code with codable children: Secondary | ICD-10-CM

## 2012-12-14 DIAGNOSIS — Z609 Problem related to social environment, unspecified: Secondary | ICD-10-CM

## 2012-12-14 DIAGNOSIS — O09529 Supervision of elderly multigravida, unspecified trimester: Secondary | ICD-10-CM

## 2012-12-14 DIAGNOSIS — O0930 Supervision of pregnancy with insufficient antenatal care, unspecified trimester: Secondary | ICD-10-CM

## 2012-12-14 LAB — POCT URINALYSIS DIP (DEVICE)
Glucose, UA: NEGATIVE mg/dL
Hgb urine dipstick: NEGATIVE
Nitrite: NEGATIVE
Specific Gravity, Urine: 1.01 (ref 1.005–1.030)
Urobilinogen, UA: 0.2 mg/dL (ref 0.0–1.0)
pH: 7 (ref 5.0–8.0)

## 2012-12-14 NOTE — Progress Notes (Signed)
Patient doing well without any complaints. 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 Patient desires another vaginal birth.

## 2012-12-14 NOTE — Progress Notes (Signed)
Pulse: 85

## 2012-12-28 ENCOUNTER — Ambulatory Visit (INDEPENDENT_AMBULATORY_CARE_PROVIDER_SITE_OTHER): Payer: BC Managed Care – PPO | Admitting: Family Medicine

## 2012-12-28 ENCOUNTER — Other Ambulatory Visit: Payer: Self-pay | Admitting: Family Medicine

## 2012-12-28 VITALS — BP 125/68 | Temp 97.4°F | Wt 148.2 lb

## 2012-12-28 DIAGNOSIS — Z609 Problem related to social environment, unspecified: Secondary | ICD-10-CM

## 2012-12-28 DIAGNOSIS — Z789 Other specified health status: Secondary | ICD-10-CM

## 2012-12-28 DIAGNOSIS — Z3480 Encounter for supervision of other normal pregnancy, unspecified trimester: Secondary | ICD-10-CM

## 2012-12-28 LAB — POCT URINALYSIS DIP (DEVICE)
Bilirubin Urine: NEGATIVE
Hgb urine dipstick: NEGATIVE
Nitrite: NEGATIVE
Protein, ur: NEGATIVE mg/dL
pH: 6 (ref 5.0–8.0)

## 2012-12-28 NOTE — Patient Instructions (Addendum)
Pregnancy - Third Trimester  The third trimester of pregnancy (the last 3 months) is a period of the most rapid growth for you and your baby. The baby approaches a length of 20 inches and a weight of 6 to 10 pounds. The baby is adding on fat and getting ready for life outside your body. While inside, babies have periods of sleeping and waking, suck their thumbs, and hiccups. You can often feel small contractions of the uterus. This is false labor. It is also called Braxton-Hicks contractions. This is like a practice for labor. The usual problems in this stage of pregnancy include more difficulty breathing, swelling of the hands and feet from water retention, and having to urinate more often because of the uterus and baby pressing on your bladder.   PRENATAL EXAMS  · Blood work may continue to be done during prenatal exams. These tests are done to check on your health and the probable health of your baby. Blood work is used to follow your blood levels (hemoglobin). Anemia (low hemoglobin) is common during pregnancy. Iron and vitamins are given to help prevent this. You may also continue to be checked for diabetes. Some of the past blood tests may be done again.  · The size of the uterus is measured during each visit. This makes sure your baby is growing properly according to your pregnancy dates.  · Your blood pressure is checked every prenatal visit. This is to make sure you are not getting toxemia.  · Your urine is checked every prenatal visit for infection, diabetes and protein.  · Your weight is checked at each visit. This is done to make sure gains are happening at the suggested rate and that you and your baby are growing normally.  · Sometimes, an ultrasound is performed to confirm the position and the proper growth and development of the baby. This is a test done that bounces harmless sound waves off the baby so your caregiver can more accurately determine due dates.  · Discuss the type of pain medication and  anesthesia you will have during your labor and delivery.  · Discuss the possibility and anesthesia if a Cesarean Section might be necessary.  · Inform your caregiver if there is any mental or physical violence at home.  Sometimes, a specialized non-stress test, contraction stress test and biophysical profile are done to make sure the baby is not having a problem. Checking the amniotic fluid surrounding the baby is called an amniocentesis. The amniotic fluid is removed by sticking a needle into the belly (abdomen). This is sometimes done near the end of pregnancy if an early delivery is required. In this case, it is done to help make sure the baby's lungs are mature enough for the baby to live outside of the womb. If the lungs are not mature and it is unsafe to deliver the baby, an injection of cortisone medication is given to the mother 1 to 2 days before the delivery. This helps the baby's lungs mature and makes it safer to deliver the baby.  CHANGES OCCURING IN THE THIRD TRIMESTER OF PREGNANCY  Your body goes through many changes during pregnancy. They vary from person to person. Talk to your caregiver about changes you notice and are concerned about.  · During the last trimester, you have probably had an increase in your appetite. It is normal to have cravings for certain foods. This varies from person to person and pregnancy to pregnancy.  · You may begin to   get stretch marks on your hips, abdomen, and breasts. These are normal changes in the body during pregnancy. There are no exercises or medications to take which prevent this change.  · Constipation may be treated with a stool softener or adding bulk to your diet. Drinking lots of fluids, fiber in vegetables, fruits, and whole grains are helpful.  · Exercising is also helpful. If you have been very active up until your pregnancy, most of these activities can be continued during your pregnancy. If you have been less active, it is helpful to start an exercise  program such as walking. Consult your caregiver before starting exercise programs.  · Avoid all smoking, alcohol, un-prescribed drugs, herbs and "street drugs" during your pregnancy. These chemicals affect the formation and growth of the baby. Avoid chemicals throughout the pregnancy to ensure the delivery of a healthy infant.  · Backache, varicose veins and hemorrhoids may develop or get worse.  · You will tire more easily in the third trimester, which is normal.  · The baby's movements may be stronger and more often.  · You may become short of breath easily.  · Your belly button may stick out.  · A yellow discharge may leak from your breasts called colostrum.  · You may have a bloody mucus discharge. This usually occurs a few days to a week before labor begins.  HOME CARE INSTRUCTIONS   · Keep your caregiver's appointments. Follow your caregiver's instructions regarding medication use, exercise, and diet.  · During pregnancy, you are providing food for you and your baby. Continue to eat regular, well-balanced meals. Choose foods such as meat, fish, milk and other low fat dairy products, vegetables, fruits, and whole-grain breads and cereals. Your caregiver will tell you of the ideal weight gain.  · A physical sexual relationship may be continued throughout pregnancy if there are no other problems such as early (premature) leaking of amniotic fluid from the membranes, vaginal bleeding, or belly (abdominal) pain.  · Exercise regularly if there are no restrictions. Check with your caregiver if you are unsure of the safety of your exercises. Greater weight gain will occur in the last 2 trimesters of pregnancy. Exercising helps:  · Control your weight.  · Get you in shape for labor and delivery.  · You lose weight after you deliver.  · Rest a lot with legs elevated, or as needed for leg cramps or low back pain.  · Wear a good support or jogging bra for breast tenderness during pregnancy. This may help if worn during  sleep. Pads or tissues may be used in the bra if you are leaking colostrum.  · Do not use hot tubs, steam rooms, or saunas.  · Wear your seat belt when driving. This protects you and your baby if you are in an accident.  · Avoid raw meat, cat litter boxes and soil used by cats. These carry germs that can cause birth defects in the baby.  · It is easier to loose urine during pregnancy. Tightening up and strengthening the pelvic muscles will help with this problem. You can practice stopping your urination while you are going to the bathroom. These are the same muscles you need to strengthen. It is also the muscles you would use if you were trying to stop from passing gas. You can practice tightening these muscles up 10 times a set and repeating this about 3 times per day. Once you know what muscles to tighten up, do not perform these   exercises during urination. It is more likely to cause an infection by backing up the urine.  · Ask for help if you have financial, counseling or nutritional needs during pregnancy. Your caregiver will be able to offer counseling for these needs as well as refer you for other special needs.  · Make a list of emergency phone numbers and have them available.  · Plan on getting help from family or friends when you go home from the hospital.  · Make a trial run to the hospital.  · Take prenatal classes with the father to understand, practice and ask questions about the labor and delivery.  · Prepare the baby's room/nursery.  · Do not travel out of the city unless it is absolutely necessary and with the advice of your caregiver.  · Wear only low or no heal shoes to have better balance and prevent falling.  MEDICATIONS AND DRUG USE IN PREGNANCY  · Take prenatal vitamins as directed. The vitamin should contain 1 milligram of folic acid. Keep all vitamins out of reach of children. Only a couple vitamins or tablets containing iron may be fatal to a baby or young child when ingested.  · Avoid use  of all medications, including herbs, over-the-counter medications, not prescribed or suggested by your caregiver. Only take over-the-counter or prescription medicines for pain, discomfort, or fever as directed by your caregiver. Do not use aspirin, ibuprofen (Motrin®, Advil®, Nuprin®) or naproxen (Aleve®) unless OK'd by your caregiver.  · Let your caregiver also know about herbs you may be using.  · Alcohol is related to a number of birth defects. This includes fetal alcohol syndrome. All alcohol, in any form, should be avoided completely. Smoking will cause low birth rate and premature babies.  · Street/illegal drugs are very harmful to the baby. They are absolutely forbidden. A baby born to an addicted mother will be addicted at birth. The baby will go through the same withdrawal an adult does.  SEEK MEDICAL CARE IF:  You have any concerns or worries during your pregnancy. It is better to call with your questions if you feel they cannot wait, rather than worry about them.  DECISIONS ABOUT CIRCUMCISION  You may or may not know the sex of your baby. If you know your baby is a boy, it may be time to think about circumcision. Circumcision is the removal of the foreskin of the penis. This is the skin that covers the sensitive end of the penis. There is no proven medical need for this. Often this decision is made on what is popular at the time or based upon religious beliefs and social issues. You can discuss these issues with your caregiver or pediatrician.  SEEK IMMEDIATE MEDICAL CARE IF:   · An unexplained oral temperature above 102° F (38.9° C) develops, or as your caregiver suggests.  · You have leaking of fluid from the vagina (birth canal). If leaking membranes are suspected, take your temperature and tell your caregiver of this when you call.  · There is vaginal spotting, bleeding or passing clots. Tell your caregiver of the amount and how many pads are used.  · You develop a bad smelling vaginal discharge with  a change in the color from clear to white.  · You develop vomiting that lasts more than 24 hours.  · You develop chills or fever.  · You develop shortness of breath.  · You develop burning on urination.  · You loose more than 2 pounds of weight   or gain more than 2 pounds of weight or as suggested by your caregiver.  · You notice sudden swelling of your face, hands, and feet or legs.  · You develop belly (abdominal) pain. Round ligament discomfort is a common non-cancerous (benign) cause of abdominal pain in pregnancy. Your caregiver still must evaluate you.  · You develop a severe headache that does not go away.  · You develop visual problems, blurred or double vision.  · If you have not felt your baby move for more than 1 hour. If you think the baby is not moving as much as usual, eat something with sugar in it and lie down on your left side for an hour. The baby should move at least 4 to 5 times per hour. Call right away if your baby moves less than that.  · You fall, are in a car accident or any kind of trauma.  · There is mental or physical violence at home.  Document Released: 10/20/2001 Document Revised: 01/18/2012 Document Reviewed: 04/24/2009  [employer]® Patient Information ©2013 [employer].

## 2012-12-28 NOTE — Progress Notes (Signed)
Pulse- 98 

## 2012-12-28 NOTE — Progress Notes (Signed)
No concerns today 

## 2013-01-11 ENCOUNTER — Ambulatory Visit (INDEPENDENT_AMBULATORY_CARE_PROVIDER_SITE_OTHER): Payer: BC Managed Care – PPO | Admitting: Advanced Practice Midwife

## 2013-01-11 ENCOUNTER — Encounter: Payer: Self-pay | Admitting: Advanced Practice Midwife

## 2013-01-11 VITALS — BP 119/74 | Temp 97.0°F | Wt 149.0 lb

## 2013-01-11 DIAGNOSIS — Z3493 Encounter for supervision of normal pregnancy, unspecified, third trimester: Secondary | ICD-10-CM

## 2013-01-11 DIAGNOSIS — Z3480 Encounter for supervision of other normal pregnancy, unspecified trimester: Secondary | ICD-10-CM

## 2013-01-11 DIAGNOSIS — O479 False labor, unspecified: Secondary | ICD-10-CM

## 2013-01-11 LAB — POCT URINALYSIS DIP (DEVICE)
Ketones, ur: NEGATIVE mg/dL
Leukocytes, UA: NEGATIVE
Protein, ur: NEGATIVE mg/dL
Specific Gravity, Urine: 1.01 (ref 1.005–1.030)
Urobilinogen, UA: 0.2 mg/dL (ref 0.0–1.0)
pH: 6.5 (ref 5.0–8.0)

## 2013-01-11 NOTE — Patient Instructions (Signed)
Normal Labor and Delivery  Your caregiver must first be sure you are in labor. Signs of labor include:  · You may pass what is called "the mucus plug" before labor begins. This is a small amount of blood stained mucus.  · Regular uterine contractions.  · The time between contractions get closer together.  · The discomfort and pain gradually gets more intense.  · Pains are mostly located in the back.  · Pains get worse when walking.  · The cervix (the opening of the uterus becomes thinner (begins to efface) and opens up (dilates).  Once you are in labor and admitted into the hospital or care center, your caregiver will do the following:  · A complete physical examination.  · Check your vital signs (blood pressure, pulse, temperature and the fetal heart rate).  · Do a vaginal examination (using a sterile glove and lubricant) to determine:  · The position (presentation) of the baby (head [vertex] or buttock first).  · The level (station) of the baby's head in the birth canal.  · The effacement and dilatation of the cervix.  · You may have your pubic hair shaved and be given an enema depending on your caregiver and the circumstance.  · An electronic monitor is usually placed on your abdomen. The monitor follows the length and intensity of the contractions, as well as the baby's heart rate.  · Usually, your caregiver will insert an IV in your arm with a bottle of sugar water. This is done as a precaution so that medications can be given to you quickly during labor or delivery.  NORMAL LABOR AND DELIVERY IS DIVIDED UP INTO 3 STAGES:  First Stage  This is when regular contractions begin and the cervix begins to efface and dilate. This stage can last from 3 to 15 hours. The end of the first stage is when the cervix is 100% effaced and 10 centimeters dilated. Pain medications may be given by   · Injection (morphine, demerol, etc.)  · Regional anesthesia (spinal, caudal or epidural, anesthetics given in different locations of  the spine). Paracervical pain medication may be given, which is an injection of and anesthetic on each side of the cervix.  A pregnant woman may request to have "Natural Childbirth" which is not to have any medications or anesthesia during her labor and delivery.  Second Stage  This is when the baby comes down through the birth canal (vagina) and is born. This can take 1 to 4 hours. As the baby's head comes down through the birth canal, you may feel like you are going to have a bowel movement. You will get the urge to bear down and push until the baby is delivered. As the baby's head is being delivered, the caregiver will decide if an episiotomy (a cut in the perineum and vagina area) is needed to prevent tearing of the tissue in this area. The episiotomy is sewn up after the delivery of the baby and placenta. Sometimes a mask with nitrous oxide is given for the mother to breath during the delivery of the baby to help if there is too much pain. The end of Stage 2 is when the baby is fully delivered. Then when the umbilical cord stops pulsating it is clamped and cut.  Third Stage  The third stage begins after the baby is completely delivered and ends after the placenta (afterbirth) is delivered. This usually takes 5 to 30 minutes. After the placenta is delivered, a medication   is given either by intravenous or injection to help contract the uterus and prevent bleeding. The third stage is not painful and pain medication is usually not necessary. If an episiotomy was done, it is repaired at this time.  After the delivery, the mother is watched and monitored closely for 1 to 2 hours to make sure there is no postpartum bleeding (hemorrhage). If there is a lot of bleeding, medication is given to contract the uterus and stop the bleeding.  Document Released: 08/04/2008 Document Revised: 01/18/2012 Document Reviewed: 08/04/2008  ExitCare® Patient Information ©2013 [employer].

## 2013-01-11 NOTE — Addendum Note (Signed)
Addended by: Franchot Mimes on: 01/11/2013 10:27 AM   Modules accepted: Orders

## 2013-01-11 NOTE — Addendum Note (Signed)
Addended by: Soyla Murphy T on: 01/11/2013 11:39 AM   Modules accepted: Orders

## 2013-01-11 NOTE — Progress Notes (Signed)
Doing well. Cultures and GBS done today. Cervix exam deferred.  Reviewed change to weekly visits and where to go to MAU PRN

## 2013-01-11 NOTE — Progress Notes (Signed)
Pulse 99 Patient states she's not taking prenatal vitamins because it makes her dizzy.

## 2013-01-18 ENCOUNTER — Other Ambulatory Visit: Payer: Self-pay | Admitting: Advanced Practice Midwife

## 2013-01-18 ENCOUNTER — Ambulatory Visit (INDEPENDENT_AMBULATORY_CARE_PROVIDER_SITE_OTHER): Payer: BC Managed Care – PPO | Admitting: Advanced Practice Midwife

## 2013-01-18 VITALS — BP 106/65 | Temp 98.3°F | Wt 152.8 lb

## 2013-01-18 DIAGNOSIS — Z3483 Encounter for supervision of other normal pregnancy, third trimester: Secondary | ICD-10-CM

## 2013-01-18 DIAGNOSIS — Z3480 Encounter for supervision of other normal pregnancy, unspecified trimester: Secondary | ICD-10-CM

## 2013-01-18 LAB — POCT URINALYSIS DIP (DEVICE)
Ketones, ur: NEGATIVE mg/dL
Protein, ur: NEGATIVE mg/dL
Specific Gravity, Urine: <=1.005 (ref 1.005–1.030)
Urobilinogen, UA: 0.2 mg/dL (ref 0.0–1.0)
pH: 7 (ref 5.0–8.0)

## 2013-01-18 NOTE — Progress Notes (Signed)
[id number] at [redacted]w[redacted]d here for routine prenatal visit S: Pt reports no contractions, LOF, or vaginal bleeding and reports good fetal movement. Some dysuria that improves when drinks water. Denies fever, CP, SOB, HA, abdominal pain, leg swelling O: See flowsheet A/P: Pregnancy proceeding normally. Pt not taking PNV through pregnancy due to itching. Return precautions reviewed. F/u in 1 week for next routine prenatal. - Plans to breastfeed - Does not want contraception as wants another baby

## 2013-01-18 NOTE — Progress Notes (Signed)
Pulse- 88 Patient reports some contractions  

## 2013-01-19 NOTE — Progress Notes (Signed)
Seen also by me. Agree with note Wynelle Bourgeois CNM

## 2013-01-19 NOTE — Patient Instructions (Signed)
Pregnancy - Third Trimester  The third trimester of pregnancy (the last 3 months) is a period of the most rapid growth for you and your baby. The baby approaches a length of 20 inches and a weight of 6 to 10 pounds. The baby is adding on fat and getting ready for life outside your body. While inside, babies have periods of sleeping and waking, suck their thumbs, and hiccups. You can often feel small contractions of the uterus. This is false labor. It is also called Braxton-Hicks contractions. This is like a practice for labor. The usual problems in this stage of pregnancy include more difficulty breathing, swelling of the hands and feet from water retention, and having to urinate more often because of the uterus and baby pressing on your bladder.   PRENATAL EXAMS  · Blood work may continue to be done during prenatal exams. These tests are done to check on your health and the probable health of your baby. Blood work is used to follow your blood levels (hemoglobin). Anemia (low hemoglobin) is common during pregnancy. Iron and vitamins are given to help prevent this. You may also continue to be checked for diabetes. Some of the past blood tests may be done again.  · The size of the uterus is measured during each visit. This makes sure your baby is growing properly according to your pregnancy dates.  · Your blood pressure is checked every prenatal visit. This is to make sure you are not getting toxemia.  · Your urine is checked every prenatal visit for infection, diabetes and protein.  · Your weight is checked at each visit. This is done to make sure gains are happening at the suggested rate and that you and your baby are growing normally.  · Sometimes, an ultrasound is performed to confirm the position and the proper growth and development of the baby. This is a test done that bounces harmless sound waves off the baby so your caregiver can more accurately determine due dates.  · Discuss the type of pain medication and  anesthesia you will have during your labor and delivery.  · Discuss the possibility and anesthesia if a Cesarean Section might be necessary.  · Inform your caregiver if there is any mental or physical violence at home.  Sometimes, a specialized non-stress test, contraction stress test and biophysical profile are done to make sure the baby is not having a problem. Checking the amniotic fluid surrounding the baby is called an amniocentesis. The amniotic fluid is removed by sticking a needle into the belly (abdomen). This is sometimes done near the end of pregnancy if an early delivery is required. In this case, it is done to help make sure the baby's lungs are mature enough for the baby to live outside of the womb. If the lungs are not mature and it is unsafe to deliver the baby, an injection of cortisone medication is given to the mother 1 to 2 days before the delivery. This helps the baby's lungs mature and makes it safer to deliver the baby.  CHANGES OCCURING IN THE THIRD TRIMESTER OF PREGNANCY  Your body goes through many changes during pregnancy. They vary from person to person. Talk to your caregiver about changes you notice and are concerned about.  · During the last trimester, you have probably had an increase in your appetite. It is normal to have cravings for certain foods. This varies from person to person and pregnancy to pregnancy.  · You may begin to   get stretch marks on your hips, abdomen, and breasts. These are normal changes in the body during pregnancy. There are no exercises or medications to take which prevent this change.  · Constipation may be treated with a stool softener or adding bulk to your diet. Drinking lots of fluids, fiber in vegetables, fruits, and whole grains are helpful.  · Exercising is also helpful. If you have been very active up until your pregnancy, most of these activities can be continued during your pregnancy. If you have been less active, it is helpful to start an exercise  program such as walking. Consult your caregiver before starting exercise programs.  · Avoid all smoking, alcohol, un-prescribed drugs, herbs and "street drugs" during your pregnancy. These chemicals affect the formation and growth of the baby. Avoid chemicals throughout the pregnancy to ensure the delivery of a healthy infant.  · Backache, varicose veins and hemorrhoids may develop or get worse.  · You will tire more easily in the third trimester, which is normal.  · The baby's movements may be stronger and more often.  · You may become short of breath easily.  · Your belly button may stick out.  · A yellow discharge may leak from your breasts called colostrum.  · You may have a bloody mucus discharge. This usually occurs a few days to a week before labor begins.  HOME CARE INSTRUCTIONS   · Keep your caregiver's appointments. Follow your caregiver's instructions regarding medication use, exercise, and diet.  · During pregnancy, you are providing food for you and your baby. Continue to eat regular, well-balanced meals. Choose foods such as meat, fish, milk and other low fat dairy products, vegetables, fruits, and whole-grain breads and cereals. Your caregiver will tell you of the ideal weight gain.  · A physical sexual relationship may be continued throughout pregnancy if there are no other problems such as early (premature) leaking of amniotic fluid from the membranes, vaginal bleeding, or belly (abdominal) pain.  · Exercise regularly if there are no restrictions. Check with your caregiver if you are unsure of the safety of your exercises. Greater weight gain will occur in the last 2 trimesters of pregnancy. Exercising helps:  · Control your weight.  · Get you in shape for labor and delivery.  · You lose weight after you deliver.  · Rest a lot with legs elevated, or as needed for leg cramps or low back pain.  · Wear a good support or jogging bra for breast tenderness during pregnancy. This may help if worn during  sleep. Pads or tissues may be used in the bra if you are leaking colostrum.  · Do not use hot tubs, steam rooms, or saunas.  · Wear your seat belt when driving. This protects you and your baby if you are in an accident.  · Avoid raw meat, cat litter boxes and soil used by cats. These carry germs that can cause birth defects in the baby.  · It is easier to loose urine during pregnancy. Tightening up and strengthening the pelvic muscles will help with this problem. You can practice stopping your urination while you are going to the bathroom. These are the same muscles you need to strengthen. It is also the muscles you would use if you were trying to stop from passing gas. You can practice tightening these muscles up 10 times a set and repeating this about 3 times per day. Once you know what muscles to tighten up, do not perform these   exercises during urination. It is more likely to cause an infection by backing up the urine.  · Ask for help if you have financial, counseling or nutritional needs during pregnancy. Your caregiver will be able to offer counseling for these needs as well as refer you for other special needs.  · Make a list of emergency phone numbers and have them available.  · Plan on getting help from family or friends when you go home from the hospital.  · Make a trial run to the hospital.  · Take prenatal classes with the father to understand, practice and ask questions about the labor and delivery.  · Prepare the baby's room/nursery.  · Do not travel out of the city unless it is absolutely necessary and with the advice of your caregiver.  · Wear only low or no heal shoes to have better balance and prevent falling.  MEDICATIONS AND DRUG USE IN PREGNANCY  · Take prenatal vitamins as directed. The vitamin should contain 1 milligram of folic acid. Keep all vitamins out of reach of children. Only a couple vitamins or tablets containing iron may be fatal to a baby or young child when ingested.  · Avoid use  of all medications, including herbs, over-the-counter medications, not prescribed or suggested by your caregiver. Only take over-the-counter or prescription medicines for pain, discomfort, or fever as directed by your caregiver. Do not use aspirin, ibuprofen (Motrin®, Advil®, Nuprin®) or naproxen (Aleve®) unless OK'd by your caregiver.  · Let your caregiver also know about herbs you may be using.  · Alcohol is related to a number of birth defects. This includes fetal alcohol syndrome. All alcohol, in any form, should be avoided completely. Smoking will cause low birth rate and premature babies.  · Street/illegal drugs are very harmful to the baby. They are absolutely forbidden. A baby born to an addicted mother will be addicted at birth. The baby will go through the same withdrawal an adult does.  SEEK MEDICAL CARE IF:  You have any concerns or worries during your pregnancy. It is better to call with your questions if you feel they cannot wait, rather than worry about them.  DECISIONS ABOUT CIRCUMCISION  You may or may not know the sex of your baby. If you know your baby is a boy, it may be time to think about circumcision. Circumcision is the removal of the foreskin of the penis. This is the skin that covers the sensitive end of the penis. There is no proven medical need for this. Often this decision is made on what is popular at the time or based upon religious beliefs and social issues. You can discuss these issues with your caregiver or pediatrician.  SEEK IMMEDIATE MEDICAL CARE IF:   · An unexplained oral temperature above 102° F (38.9° C) develops, or as your caregiver suggests.  · You have leaking of fluid from the vagina (birth canal). If leaking membranes are suspected, take your temperature and tell your caregiver of this when you call.  · There is vaginal spotting, bleeding or passing clots. Tell your caregiver of the amount and how many pads are used.  · You develop a bad smelling vaginal discharge with  a change in the color from clear to white.  · You develop vomiting that lasts more than 24 hours.  · You develop chills or fever.  · You develop shortness of breath.  · You develop burning on urination.  · You loose more than 2 pounds of weight   or gain more than 2 pounds of weight or as suggested by your caregiver.  · You notice sudden swelling of your face, hands, and feet or legs.  · You develop belly (abdominal) pain. Round ligament discomfort is a common non-cancerous (benign) cause of abdominal pain in pregnancy. Your caregiver still must evaluate you.  · You develop a severe headache that does not go away.  · You develop visual problems, blurred or double vision.  · If you have not felt your baby move for more than 1 hour. If you think the baby is not moving as much as usual, eat something with sugar in it and lie down on your left side for an hour. The baby should move at least 4 to 5 times per hour. Call right away if your baby moves less than that.  · You fall, are in a car accident or any kind of trauma.  · There is mental or physical violence at home.  Document Released: 10/20/2001 Document Revised: 01/18/2012 Document Reviewed: 04/24/2009  ExitCare® Patient Information ©2013 [employer].

## 2013-01-25 ENCOUNTER — Other Ambulatory Visit: Payer: Self-pay | Admitting: Advanced Practice Midwife

## 2013-01-25 ENCOUNTER — Ambulatory Visit (INDEPENDENT_AMBULATORY_CARE_PROVIDER_SITE_OTHER): Payer: BC Managed Care – PPO | Admitting: Advanced Practice Midwife

## 2013-01-25 VITALS — BP 123/72 | Temp 98.0°F | Wt 153.4 lb

## 2013-01-25 DIAGNOSIS — Z3480 Encounter for supervision of other normal pregnancy, unspecified trimester: Secondary | ICD-10-CM

## 2013-01-25 DIAGNOSIS — Z3493 Encounter for supervision of normal pregnancy, unspecified, third trimester: Secondary | ICD-10-CM

## 2013-01-25 LAB — POCT URINALYSIS DIP (DEVICE)
Hgb urine dipstick: NEGATIVE
Protein, ur: NEGATIVE mg/dL
Specific Gravity, Urine: 1.01 (ref 1.005–1.030)
Urobilinogen, UA: 0.2 mg/dL (ref 0.0–1.0)

## 2013-01-25 NOTE — Patient Instructions (Signed)
Normal Labor and Delivery  Your caregiver must first be sure you are in labor. Signs of labor include:  · You may pass what is called "the mucus plug" before labor begins. This is a small amount of blood stained mucus.  · Regular uterine contractions.  · The time between contractions get closer together.  · The discomfort and pain gradually gets more intense.  · Pains are mostly located in the back.  · Pains get worse when walking.  · The cervix (the opening of the uterus becomes thinner (begins to efface) and opens up (dilates).  Once you are in labor and admitted into the hospital or care center, your caregiver will do the following:  · A complete physical examination.  · Check your vital signs (blood pressure, pulse, temperature and the fetal heart rate).  · Do a vaginal examination (using a sterile glove and lubricant) to determine:  · The position (presentation) of the baby (head [vertex] or buttock first).  · The level (station) of the baby's head in the birth canal.  · The effacement and dilatation of the cervix.  · You may have your pubic hair shaved and be given an enema depending on your caregiver and the circumstance.  · An electronic monitor is usually placed on your abdomen. The monitor follows the length and intensity of the contractions, as well as the baby's heart rate.  · Usually, your caregiver will insert an IV in your arm with a bottle of sugar water. This is done as a precaution so that medications can be given to you quickly during labor or delivery.  NORMAL LABOR AND DELIVERY IS DIVIDED UP INTO 3 STAGES:  First Stage  This is when regular contractions begin and the cervix begins to efface and dilate. This stage can last from 3 to 15 hours. The end of the first stage is when the cervix is 100% effaced and 10 centimeters dilated. Pain medications may be given by   · Injection (morphine, demerol, etc.)  · Regional anesthesia (spinal, caudal or epidural, anesthetics given in different locations of  the spine). Paracervical pain medication may be given, which is an injection of and anesthetic on each side of the cervix.  A pregnant woman may request to have "Natural Childbirth" which is not to have any medications or anesthesia during her labor and delivery.  Second Stage  This is when the baby comes down through the birth canal (vagina) and is born. This can take 1 to 4 hours. As the baby's head comes down through the birth canal, you may feel like you are going to have a bowel movement. You will get the urge to bear down and push until the baby is delivered. As the baby's head is being delivered, the caregiver will decide if an episiotomy (a cut in the perineum and vagina area) is needed to prevent tearing of the tissue in this area. The episiotomy is sewn up after the delivery of the baby and placenta. Sometimes a mask with nitrous oxide is given for the mother to breath during the delivery of the baby to help if there is too much pain. The end of Stage 2 is when the baby is fully delivered. Then when the umbilical cord stops pulsating it is clamped and cut.  Third Stage  The third stage begins after the baby is completely delivered and ends after the placenta (afterbirth) is delivered. This usually takes 5 to 30 minutes. After the placenta is delivered, a medication   is given either by intravenous or injection to help contract the uterus and prevent bleeding. The third stage is not painful and pain medication is usually not necessary. If an episiotomy was done, it is repaired at this time.  After the delivery, the mother is watched and monitored closely for 1 to 2 hours to make sure there is no postpartum bleeding (hemorrhage). If there is a lot of bleeding, medication is given to contract the uterus and stop the bleeding.  Document Released: 08/04/2008 Document Revised: 01/18/2012 Document Reviewed: 08/04/2008  ExitCare® Patient Information ©2013 [employer].

## 2013-01-25 NOTE — Progress Notes (Signed)
Doing well. No contractions. Reviewed signs of labor

## 2013-02-08 ENCOUNTER — Ambulatory Visit (INDEPENDENT_AMBULATORY_CARE_PROVIDER_SITE_OTHER): Payer: BC Managed Care – PPO | Admitting: Family Medicine

## 2013-02-08 VITALS — BP 114/60 | Wt 152.3 lb

## 2013-02-08 DIAGNOSIS — O0933 Supervision of pregnancy with insufficient antenatal care, third trimester: Secondary | ICD-10-CM

## 2013-02-08 DIAGNOSIS — O0930 Supervision of pregnancy with insufficient antenatal care, unspecified trimester: Secondary | ICD-10-CM

## 2013-02-08 LAB — POCT URINALYSIS DIP (DEVICE)
Hgb urine dipstick: NEGATIVE
Nitrite: NEGATIVE
Protein, ur: NEGATIVE mg/dL
Urobilinogen, UA: 0.2 mg/dL (ref 0.0–1.0)
pH: 7 (ref 5.0–8.0)

## 2013-02-08 NOTE — Progress Notes (Signed)
Informal US for presentation - vertex.  

## 2013-02-08 NOTE — Patient Instructions (Signed)
Normal Labor and Delivery  Your caregiver must first be sure you are in labor. Signs of labor include:  · You may pass what is called "the mucus plug" before labor begins. This is a small amount of blood stained mucus.  · Regular uterine contractions.  · The time between contractions get closer together.  · The discomfort and pain gradually gets more intense.  · Pains are mostly located in the back.  · Pains get worse when walking.  · The cervix (the opening of the uterus becomes thinner (begins to efface) and opens up (dilates).  Once you are in labor and admitted into the hospital or care center, your caregiver will do the following:  · A complete physical examination.  · Check your vital signs (blood pressure, pulse, temperature and the fetal heart rate).  · Do a vaginal examination (using a sterile glove and lubricant) to determine:  · The position (presentation) of the baby (head [vertex] or buttock first).  · The level (station) of the baby's head in the birth canal.  · The effacement and dilatation of the cervix.  · You may have your pubic hair shaved and be given an enema depending on your caregiver and the circumstance.  · An electronic monitor is usually placed on your abdomen. The monitor follows the length and intensity of the contractions, as well as the baby's heart rate.  · Usually, your caregiver will insert an IV in your arm with a bottle of sugar water. This is done as a precaution so that medications can be given to you quickly during labor or delivery.  NORMAL LABOR AND DELIVERY IS DIVIDED UP INTO 3 STAGES:  First Stage  This is when regular contractions begin and the cervix begins to efface and dilate. This stage can last from 3 to 15 hours. The end of the first stage is when the cervix is 100% effaced and 10 centimeters dilated. Pain medications may be given by   · Injection (morphine, demerol, etc.)  · Regional anesthesia (spinal, caudal or epidural, anesthetics given in different locations of  the spine). Paracervical pain medication may be given, which is an injection of and anesthetic on each side of the cervix.  A pregnant woman may request to have "Natural Childbirth" which is not to have any medications or anesthesia during her labor and delivery.  Second Stage  This is when the baby comes down through the birth canal (vagina) and is born. This can take 1 to 4 hours. As the baby's head comes down through the birth canal, you may feel like you are going to have a bowel movement. You will get the urge to bear down and push until the baby is delivered. As the baby's head is being delivered, the caregiver will decide if an episiotomy (a cut in the perineum and vagina area) is needed to prevent tearing of the tissue in this area. The episiotomy is sewn up after the delivery of the baby and placenta. Sometimes a mask with nitrous oxide is given for the mother to breath during the delivery of the baby to help if there is too much pain. The end of Stage 2 is when the baby is fully delivered. Then when the umbilical cord stops pulsating it is clamped and cut.  Third Stage  The third stage begins after the baby is completely delivered and ends after the placenta (afterbirth) is delivered. This usually takes 5 to 30 minutes. After the placenta is delivered, a medication   is given either by intravenous or injection to help contract the uterus and prevent bleeding. The third stage is not painful and pain medication is usually not necessary. If an episiotomy was done, it is repaired at this time.  After the delivery, the mother is watched and monitored closely for 1 to 2 hours to make sure there is no postpartum bleeding (hemorrhage). If there is a lot of bleeding, medication is given to contract the uterus and stop the bleeding.  Document Released: 08/04/2008 Document Revised: 01/18/2012 Document Reviewed: 08/04/2008  ExitCare® Patient Information ©2013 [employer].

## 2013-02-08 NOTE — Progress Notes (Signed)
Pulse: 86

## 2013-02-08 NOTE — Progress Notes (Signed)
Mild contractions. No other concerns.

## 2013-02-14 ENCOUNTER — Encounter (HOSPITAL_COMMUNITY): Payer: Self-pay | Admitting: *Deleted

## 2013-02-14 ENCOUNTER — Inpatient Hospital Stay (HOSPITAL_COMMUNITY)
Admission: AD | Admit: 2013-02-14 | Discharge: 2013-02-16 | DRG: 373 | Disposition: A | Discharge status: Home / Self Care | Payer: BC Managed Care – PPO | Source: Ambulatory Visit | Attending: Obstetrics and Gynecology | Admitting: Obstetrics and Gynecology

## 2013-02-14 DIAGNOSIS — O09529 Supervision of elderly multigravida, unspecified trimester: Secondary | ICD-10-CM | POA: Diagnosis present

## 2013-02-14 HISTORY — DX: Other specified health status: Z78.9

## 2013-02-14 LAB — TYPE AND SCREEN: ABO/RH(D): O POS

## 2013-02-14 LAB — CBC
MCH: 26.9 pg (ref 26.0–34.0)
MCHC: 32.5 g/dL (ref 30.0–36.0)
MCV: 82.8 fL (ref 78.0–100.0)
Platelets: 257 10*3/uL (ref 150–400)
RBC: 3.83 MIL/uL — ABNORMAL LOW (ref 3.87–5.11)

## 2013-02-14 MED ORDER — ONDANSETRON HCL 4 MG/2ML IJ SOLN: 4.0000 mg | INTRAMUSCULAR | Status: DC | PRN

## 2013-02-14 MED ORDER — LACTATED RINGERS IV SOLN: 500.0000 mL | INTRAVENOUS | Status: DC | PRN

## 2013-02-14 MED ORDER — LIDOCAINE HCL (PF) 1 % IJ SOLN
30.0000 mL | INTRAMUSCULAR | Status: AC | PRN
  Administered 2013-02-14: 30 mL via SUBCUTANEOUS
  Filled 2013-02-14 (×2): qty 30

## 2013-02-14 MED ORDER — IBUPROFEN 600 MG PO TABS
600.0000 mg | ORAL_TABLET | Freq: Four times a day (QID) | ORAL | Status: DC | PRN
  Filled 2013-02-14: qty 1

## 2013-02-14 MED ORDER — SENNOSIDES-DOCUSATE SODIUM 8.6-50 MG PO TABS
2.0000 | ORAL_TABLET | Freq: Every day | ORAL | Status: DC
  Administered 2013-02-14 – 2013-02-15 (×2): 2 via ORAL

## 2013-02-14 MED ORDER — ACETAMINOPHEN 325 MG PO TABS: 650.0000 mg | ORAL_TABLET | ORAL | Status: DC | PRN

## 2013-02-14 MED ORDER — OXYTOCIN BOLUS FROM INFUSION: 500.0000 mL | INTRAVENOUS | Status: DC

## 2013-02-14 MED ORDER — IBUPROFEN 600 MG PO TABS
600.0000 mg | ORAL_TABLET | Freq: Four times a day (QID) | ORAL | Status: DC
  Administered 2013-02-14 – 2013-02-16 (×9): 600 mg via ORAL
  Filled 2013-02-14 (×8): qty 1

## 2013-02-14 MED ORDER — BENZOCAINE-MENTHOL 20-0.5 % EX AERO
1.0000 "application " | INHALATION_SPRAY | CUTANEOUS | Status: DC | PRN
  Administered 2013-02-14: 1 via TOPICAL
  Filled 2013-02-14 (×2): qty 56

## 2013-02-14 MED ORDER — WITCH HAZEL-GLYCERIN EX PADS: 1.0000 "application " | MEDICATED_PAD | CUTANEOUS | Status: DC | PRN

## 2013-02-14 MED ORDER — ONDANSETRON HCL 4 MG/2ML IJ SOLN: 4.0000 mg | Freq: Four times a day (QID) | INTRAMUSCULAR | Status: DC | PRN

## 2013-02-14 MED ORDER — ZOLPIDEM TARTRATE 5 MG PO TABS: 5.0000 mg | ORAL_TABLET | Freq: Every evening | ORAL | Status: DC | PRN

## 2013-02-14 MED ORDER — CITRIC ACID-SODIUM CITRATE 334-500 MG/5ML PO SOLN: 30.0000 mL | ORAL | Status: DC | PRN

## 2013-02-14 MED ORDER — ONDANSETRON HCL 4 MG PO TABS: 4.0000 mg | ORAL_TABLET | ORAL | Status: DC | PRN

## 2013-02-14 MED ORDER — DIPHENHYDRAMINE HCL 25 MG PO CAPS: 25.0000 mg | ORAL_CAPSULE | Freq: Four times a day (QID) | ORAL | Status: DC | PRN

## 2013-02-14 MED ORDER — LANOLIN HYDROUS EX OINT: TOPICAL_OINTMENT | CUTANEOUS | Status: DC | PRN

## 2013-02-14 MED ORDER — PRENATAL MULTIVITAMIN CH
1.0000 | ORAL_TABLET | Freq: Every day | ORAL | Status: DC
  Administered 2013-02-15 – 2013-02-16 (×2): 1 via ORAL
  Filled 2013-02-14 (×2): qty 1

## 2013-02-14 MED ORDER — OXYCODONE-ACETAMINOPHEN 5-325 MG PO TABS
1.0000 | ORAL_TABLET | ORAL | Status: DC | PRN
  Administered 2013-02-15 – 2013-02-16 (×2): 1 via ORAL
  Filled 2013-02-14 (×2): qty 1

## 2013-02-14 MED ORDER — TETANUS-DIPHTH-ACELL PERTUSSIS 5-2.5-18.5 LF-MCG/0.5 IM SUSP: 0.5000 mL | Freq: Once | INTRAMUSCULAR | Status: DC

## 2013-02-14 MED ORDER — LACTATED RINGERS IV SOLN
INTRAVENOUS | Status: DC
  Administered 2013-02-14: 08:00:00 via INTRAVENOUS

## 2013-02-14 MED ORDER — DIBUCAINE 1 % RE OINT: 1.0000 "application " | TOPICAL_OINTMENT | RECTAL | Status: DC | PRN

## 2013-02-14 MED ORDER — OXYCODONE-ACETAMINOPHEN 5-325 MG PO TABS: 1.0000 | ORAL_TABLET | ORAL | Status: DC | PRN

## 2013-02-14 MED ORDER — OXYTOCIN 40 UNITS IN LACTATED RINGERS INFUSION - SIMPLE MED
62.5000 mL/h | INTRAVENOUS | Status: DC
  Administered 2013-02-14: 62.5 mL/h via INTRAVENOUS
  Filled 2013-02-14: qty 1000

## 2013-02-14 MED ORDER — SIMETHICONE 80 MG PO CHEW: 80.0000 mg | CHEWABLE_TABLET | ORAL | Status: DC | PRN

## 2013-02-14 NOTE — H&P (Signed)
Erin Buckley is a 41 y.o. [id number] at [redacted]w[redacted]d via LMP who presents for rupture of membranes.  Patient does not speak [place], and initially has one friend present who is able to speak some English and was used to interpret. After this family member left, a [place] phone interpreter was used to communicate with another family member who spoke both [place] and the patient's native language, Counselling psychologist. No interpreter was available to speak [place] to patient. History was thus difficult to obtain.  She is followed in the low risk clinic here at [facility]. This pregnancy complicated by late to prenatal care (27 weeks) and advanced maternal age.  History OB History   Grav Para Term Preterm Abortions TAB SAB Ect Mult Living   3 1 1  1  1         First delivery complicated by fourth degree laceration and vacuum assisted delivery for maternal exhaustion.  Past Medical History  Diagnosis Date  . No pertinent past medical history    Past Surgical History  Procedure Laterality Date  . No past surgeries     Family History: family history is not on file. Social History: denies smoking, drinking alcohol, or using drugs  Medications: denies taking any medications Allergies: denies allergies  Prenatal Transfer Tool  Maternal Diabetes: No Genetic Screening: Declined Maternal Ultrasounds/Referrals: Normal Fetal Ultrasounds or other Referrals:  None Maternal Substance Abuse:  No Significant Maternal Medications:  None Significant Maternal Lab Results:  Lab values include: Group B Strep negative Other Comments:  late to prenatal care (27 weeks)  ROS per HPI - difficult to do full ROS due to significant language barrier  Dilation: 7.5 Effacement (%): 80 Station: -2 Exam by:: Dorrene German RN Last menstrual period 05/06/2012, unknown if currently breastfeeding. Exam Physical Exam  Gen: NAD, appears to be painfully contracting Heart: RRR Lungs: CTAB, NWOB Abd: gravid, nontender to  palpation Ext: no appreciable LE edema bilaterally Neuro: grossly nonfocal, speech intact GU: see cervical exam above  Prenatal labs: ABO, Rh: O/POS/-- (01/09 1058) Antibody: NEG (01/09 1058) Rubella: 14.70 (01/09 1058) RPR: NON REAC (01/09 1058)  HBsAg: NEGATIVE (01/09 1058)  HIV: NON REACTIVE (01/09 1058)  GBS:   NEGATIVE  FHR: baseline 120s-130s, moderate variability, + accels, some early decels/variables while pushing Toco: regular ctx q2 min  Assessment/Plan: Erin Buckley is a 42 y.o. [id number] at [redacted]w[redacted]d who presents with rupture of membranes and cervical dilation, in active labor.  -Admit to labor and delivery for expectant management  -FHR reassuring -GBS negative, no abx indicated at this time -Anticipate SVD    Levert Feinstein 02/14/2013, 7:46 AM

## 2013-02-14 NOTE — MAU Note (Signed)
Pt in through EMS to MAU for SROM, fluid clear, awaiting Elissa Lovett interpreter via language line. Dr. Erin Fulling on unit, notified pt grossly ruptured.

## 2013-02-14 NOTE — H&P (Signed)
I was present for the exam and agree with above.  [place], CNM 02/14/2013 4:02 PM

## 2013-02-14 NOTE — Progress Notes (Signed)
Pt delivered baby's head at 0936, McRoberts already in progress; lowered head of bed to flat. This RN pushed emergency button for extra help.  Ten seconds later, at 0937, suprapubic was applied by this RN in addition to McRoberts.  Three more RN's entered room to assist.  Suprapubic was interrupted and attempted again within same minute under direction of CNM. CNM used Woods screw maneuver, then at [phone] delivered posterior shoulder with then delivery of infant. See delivery record and CNM/MD note.

## 2013-02-14 NOTE — Progress Notes (Signed)
This RN called [place] interpreter to assist in communication for pt.  Called at 1120 and call ended at 1140 since interpreter was able to assist for this RN, the Nursery RN, and then the Pediatrician.  Explained for pt about her delivery, her repair needed, postpartum routines including activity, meals, peri care, swollen perineum, pain management, IV, infant care, infant's assessment findings, routine for infant including skin to skin & breastfeeding measures & benefits, infant will remain with pt at all times through to discharge to home on Thursday.  Also asked if patient had any questions or concerns. Pt had a few questions through conversation, which were answered for her, but no more before call was ended with interpreter. Interpreter arrived to assist for pt at 1150, just before transfer to Mother/Baby Unit.  He remained with pt during transfer and after arrival to rm 129. Reported all of above to receiving RNs.

## 2013-02-15 ENCOUNTER — Other Ambulatory Visit: Payer: BC Managed Care – PPO

## 2013-02-15 LAB — CBC
Hemoglobin: 9.8 g/dL — ABNORMAL LOW (ref 12.0–15.0)
MCH: 27.3 pg (ref 26.0–34.0)
MCHC: 33.2 g/dL (ref 30.0–36.0)
Platelets: 242 10*3/uL (ref 150–400)

## 2013-02-15 NOTE — Lactation Note (Signed)
This note was copied from the chart of Erin Cale Dea. Lactation Consultation Note Mom states br feeding is going well. Mom br feeding baby in side lying on the left when I enter room. Baby is lying on her back and turning head to reach the breast. Demonstrated to mom how to align the baby facing straight to the breast to improve baby's latch and milk transfer and mom's comfort. Mom is concerned that she does not have enough milk yet, asking about formula,  reassured mom of baby's small stomach and enc mom to continue frequent STS and cue based br feeding, and to avoid formula unless medically necessary.  Interpreter present. Questions answered. Enc mom to call for assistance if needed.   Patient Name: Erin Buckley Today's Date: 02/15/2013 Reason for consult: Follow-up assessment   Maternal Data    Feeding Feeding Type: Breast Milk Feeding method: Breast Length of feed: 15 min  LATCH Score/Interventions Latch: Grasps breast easily, tongue down, lips flanged, rhythmical sucking.  Audible Swallowing: Spontaneous and intermittent  Type of Nipple: Everted at rest and after stimulation  Comfort (Breast/Nipple): Soft / non-tender     Hold (Positioning): Assistance needed to correctly position infant at breast and maintain latch. Intervention(s): Position options;Support Pillows;Skin to skin  LATCH Score: 9  Lactation Tools Discussed/Used     Consult Status Consult Status: Follow-up Follow-up type: In-patient    Octavio Manns [facility] 02/15/2013, 11:29 AM

## 2013-02-15 NOTE — Progress Notes (Signed)
Post Partum Day 1 Subjective: unable to assess - does not speak English  Objective: Blood pressure 103/65, pulse 91, temperature 97.9 F (36.6 C), temperature source Oral, resp. rate 17, height 5' 2.25" (1.581 m), weight 153 lb (69.4 kg), last menstrual period 05/06/2012, SpO2 100.00%, not currently breastfeeding.  Physical Exam:  General: alert, cooperative and no distress, ambulating well from bathroom to bed Lochia: unknown Uterine Fundus: firm Incision: n/a DVT Evaluation: No evidence of DVT seen on physical exam. Negative Homan's sign. No cords or calf tenderness.   Recent Labs  02/14/13 0750 02/15/13 0620  HGB 10.3* 9.8*  HCT 31.7* 29.5*    Assessment/Plan: Plan for discharge tomorrow Breastfeeding per baby's chart Unknown about contraception Will try to get an interpreter later this morning or early afternoon to interview mom in greater detail   LOS: 1 day   Levert Feinstein 02/15/2013, 8:02 AM

## 2013-02-15 NOTE — Progress Notes (Signed)
Late entry Interval Progress Note:  Went back this PM to see patient after I was able to get a [place] interpreter on the phone.  She reports tolerating PO, voiding well, lochia appropriate, passing flatus. She is breastfeeding and will either do OCPs or depo for birth control. Advised that she schedule an appointment in the outpatient clinic to discuss her options. Reminded her about six week pelvic rest recommendation after delivery.  Levert Feinstein, MD Family Medicine PGY-1

## 2013-02-16 MED ORDER — IBUPROFEN 600 MG PO TABS: 600.0000 mg | ORAL_TABLET | Freq: Four times a day (QID) | ORAL | Status: DC | PRN

## 2013-02-16 MED ORDER — DOCUSATE SODIUM 100 MG PO CAPS: 100.0000 mg | ORAL_CAPSULE | Freq: Two times a day (BID) | ORAL | Status: DC | PRN

## 2013-02-16 MED ORDER — PRENATAL MULTIVITAMIN CH: 1.0000 | ORAL_TABLET | Freq: Every day | ORAL | Status: DC

## 2013-02-16 MED ORDER — FERROUS SULFATE 325 (65 FE) MG PO TABS: 325.0000 mg | ORAL_TABLET | Freq: Two times a day (BID) | ORAL | Status: DC

## 2013-02-16 NOTE — Discharge Instructions (Signed)
Vaginal Delivery Care After  Change your pad on each trip to the bathroom.  Wipe gently with toilet paper during your hospital stay. Always wipe from front to back. A spray bottle with warm tap water could also be used or a towelette if available.  Place your soiled pad and toilet paper in a bathroom wastebasket with a plastic bag liner.  During your hospital stay, save any clots. If you pass a clot while on the toilet, do not flush it. Also, if your vaginal flow seems excessive to you, notify nursing personnel.  The first time you get out of bed after delivery, wait for assistance from a nurse. Do not get up alone at any time if you feel weak or dizzy.  Bend and extend your ankles forcefully so that you feel the calves of your legs get hard. Do this 6 times every hour when you are in bed and awake.  Do not sit with one foot under you, dangle your legs over the edge of the bed, or maintain a position that hinders the circulation in your legs.  Many women experience after pains for 2 to 3 days after delivery. These after pains are mild uterine contractions. Ask the nurse for a pain medication if you need something for this. Sometimes breastfeeding stimulates after pains; if you find this to be true, ask for the medication  -  hour before the next feeding.  For you and your infant's protection, do not go beyond the door(s) of the obstetric unit. Do not carry your baby in your arms in the hallway. When taking your baby to and from your room, put your baby in the bassinet and push the bassinet.  Mothers may have their babies in their room as much as they desire. Document Released: 10/23/2000 Document Revised: 01/18/2012 Document Reviewed: 09/23/2007 [facility] Patient Information 2013 [employer], [place].  N?u B?n Sinh Con Qua ???ng T? Nhin, Ch?m [place] Sau Khi Sinh  (Vaginal Delivery, Care After)  Thay b?ng v? sinh m?i l?n ?i v? sinh.  Dng kh?n gi?y ??t thay gi?y v? sinh trong th?i gian b?n  ?ang ? b?nh vi?n; v lun ph?i lau t? tr??c ra sau. M?t bnh x?t v?i vi n??c ?m c?ng c th? ???c s? d?ng ho?c kh?n ??t, n?u c.  ?? b?ng v? sinh v kh?n gi?y ??t ? b?n vo s?t rc c ti nh?a lt mu ?? trong nh v? sinh.  Trong th?i gian ? b?nh vi?n, hy gi? l?i b?t k? c?c no. N?u b?n c ra c?c trong khi ?i ngoi, xin ??ng gi?i ?i. ??ng th?i, n?u m ??o c?a b?n ra mu qu nhi?u, hy bo cho nhn vin ?i?u d??ng bi?t.  L?n ??u tin b?n ra kh?i gi??ng sau khi sinh con, hy ??i y t gip. Khng ???c t? ??ng d?y b?t k? lc no n?u b?n c?m th?y y?u ho?c chng m?t.  Co d??i m?t c chn m?nh ?? b?n c?m th?y hai b?p chn c?ng h?n kho?ng su l?n m?i gi? khi b?n ?ang th?c v khi ? trn gi??ng.  Khng ng?i ? ln chn, ?u chn ? mp gi??ng ho?c duy tr ? m?t v? tr lm c?n tr? s? l?u thng ? chn.  Nhi?u ph? n? b? ch?ng ?au t? cung sau khi sinh n? sau 2 ??n 3 ngy sau khi sinh. Ch?ng ?au t? cung sau khi sinh n? l cc c?n co th?t t? cung nh?Carmon Sails h?i y t ?? c thu?c gi?m ?au n?u  b?n c?n thu?c gi?m ?au cho ch?ng ny. ?i khi nui con b?ng s?a m? kch thch ?au t? cung sau khi sinh n?, n?u b?n th?y ?i?u ny l ?ng, hy yu c?u thu?c  -  gi? tr??c khi cho b ti?p theo.  ?? b?o v? b?n v con b?n, khng ?i v??t ra ngoi cnh c?a c?a khoa s?n. Khng ???c b? con trn tay trong hnh lang. Khi mang con b?n ??n v ?i t? phng c?a b?n, ??t em b trong ni v ??y ni.  Cc b m? c th? cho con ? trong phng h? ty theo  mu?n. Document Released: 10/26/2005 Document Revised: 01/18/2012 [facility] Patient Information 2013 [place], [place].

## 2013-02-16 NOTE — Discharge Summary (Signed)
Obstetric Discharge Summary Reason for Admission: onset of labor Prenatal Procedures: none Intrapartum Procedures: spontaneous vaginal delivery Postpartum Procedures: none Complications-Operative and Postpartum: second degree perineal laceration Hemoglobin  Date Value Range Status  02/15/2013 9.8* 12.0 - 15.0 g/dL Final     HCT  Date Value Range Status  02/15/2013 29.5* 36.0 - 46.0 % Final    Physical Exam:  General: alert, cooperative and no distress Lochia: appropriate Uterine Fundus: firm Incision: n/a DVT Evaluation: No evidence of DVT seen on physical exam. Negative Homan's sign. No cords or calf tenderness.  Discharge Diagnoses: Term Pregnancy-delivered  Discharge Information: Date: 02/16/2013 Activity: pelvic rest Diet: routine Medications: PNV, Ibuprofen, Colace and Iron Condition: stable Instructions: refer to practice specific booklet Discharge to: home  Prior to discharge instructions were explained to patient using an in-person interpreter.  Newborn Data: Live born female  Birth Weight: 7 lb 15.7 oz (3620 g) APGAR: 8, 9  Baby has jaundice and is receiving phototherapy, will stay in hospital as baby patient per pediatric teaching service.  Levert Feinstein 02/16/2013, 9:40 AM  .I have seen the patient with the resident/student and agree with the above.  Tawnya Crook

## 2013-02-20 NOTE — Progress Notes (Signed)
Attestation of Attending Supervision of Advanced Practitioner (CNM/NP): Evaluation and management procedures were performed by the Advanced Practitioner under my supervision and collaboration.  I have reviewed the Advanced Practitioner's note and chart, and I agree with the management and plan.  Anely Spiewak 02/20/2013 12:09 PM

## 2014-09-10 ENCOUNTER — Encounter (HOSPITAL_COMMUNITY): Payer: Self-pay | Admitting: *Deleted

## 2015-07-22 ENCOUNTER — Encounter: Payer: Self-pay | Admitting: *Deleted

## 2015-07-22 ENCOUNTER — Ambulatory Visit (INDEPENDENT_AMBULATORY_CARE_PROVIDER_SITE_OTHER): Payer: Self-pay | Admitting: *Deleted

## 2015-07-22 DIAGNOSIS — Z3200 Encounter for pregnancy test, result unknown: Secondary | ICD-10-CM

## 2015-07-22 DIAGNOSIS — Z3201 Encounter for pregnancy test, result positive: Secondary | ICD-10-CM

## 2015-07-22 LAB — POCT PREGNANCY, URINE: Preg Test, Ur: POSITIVE — AB

## 2015-07-22 NOTE — Progress Notes (Signed)
Pt in for pregnancy test, result is positive. [id number] [facility] interpreter used as Estanislado Spire interpreter not available. Advised patient to begin prenatal care at [facility] per Dr. Erin Fulling. Pregnancy Verification letter given to patient.

## 2015-09-24 ENCOUNTER — Ambulatory Visit (INDEPENDENT_AMBULATORY_CARE_PROVIDER_SITE_OTHER): Payer: Medicaid Other | Admitting: Certified Nurse Midwife

## 2015-09-24 ENCOUNTER — Other Ambulatory Visit (HOSPITAL_COMMUNITY)
Admission: RE | Admit: 2015-09-24 | Discharge: 2015-09-24 | Disposition: A | Discharge status: Home / Self Care | Payer: PRIVATE HEALTH INSURANCE | Source: Ambulatory Visit | Attending: Certified Nurse Midwife | Admitting: Certified Nurse Midwife

## 2015-09-24 ENCOUNTER — Encounter: Payer: Self-pay | Admitting: Certified Nurse Midwife

## 2015-09-24 VITALS — BP 107/73 | HR 89 | Temp 98.9°F | Wt 138.7 lb

## 2015-09-24 DIAGNOSIS — Z113 Encounter for screening for infections with a predominantly sexual mode of transmission: Secondary | ICD-10-CM

## 2015-09-24 DIAGNOSIS — O3680X0 Pregnancy with inconclusive fetal viability, not applicable or unspecified: Secondary | ICD-10-CM

## 2015-09-24 DIAGNOSIS — O09522 Supervision of elderly multigravida, second trimester: Secondary | ICD-10-CM | POA: Diagnosis not present

## 2015-09-24 DIAGNOSIS — O0932 Supervision of pregnancy with insufficient antenatal care, second trimester: Secondary | ICD-10-CM | POA: Diagnosis not present

## 2015-09-24 DIAGNOSIS — O3680X1 Pregnancy with inconclusive fetal viability, fetus 1: Secondary | ICD-10-CM

## 2015-09-24 DIAGNOSIS — Z23 Encounter for immunization: Secondary | ICD-10-CM | POA: Diagnosis not present

## 2015-09-24 LAB — POCT URINALYSIS DIP (DEVICE)
Bilirubin Urine: NEGATIVE
Glucose, UA: NEGATIVE mg/dL
Hgb urine dipstick: NEGATIVE
Ketones, ur: NEGATIVE mg/dL
Nitrite: NEGATIVE
Protein, ur: NEGATIVE mg/dL
Specific Gravity, Urine: 1.015 (ref 1.005–1.030)
Urobilinogen, UA: 0.2 mg/dL (ref 0.0–1.0)
pH: 6.5 (ref 5.0–8.0)

## 2015-09-24 NOTE — Patient Instructions (Signed)

## 2015-09-24 NOTE — Progress Notes (Addendum)
Interpreter Jamelle HaringSnow present for encounter. Bedside US for viability = Single IUP, FHR - 154 bpm per PW doppler, FM present, FL - 3.62 cm (4934w3d).

## 2015-09-24 NOTE — Progress Notes (Signed)
Subjective:    Erin Buckley is a [id number] 6242w3d being seen today for her first obstetrical visit.  Her obstetrical history is significant for advanced maternal age and language barrier. Patient does intend to breast feed. Pregnancy history fully reviewed.  Patient reports no complaints.  Clinic  Prenatal Labs  Dating  Blood type:     Genetic Screen 1 Screen:    AFP:     Quad:     NIPS: Antibody:   Anatomic US  Rubella:    GTT Early:               Third trimester:  RPR:     Flu vaccine  HBsAg:     TDaP vaccine                                               Rhogam: HIV:     Baby Food                                               GBS: (For PCN allergy, check sensitivities)  Contraception  Pap:  Circumcision    Pediatrician    Support Person      Filed Vitals:   09/24/15 1346  BP: 107/73  Pulse: 89  Temp: 98.9 F (37.2 C)  Weight: 138 lb 11.2 oz (62.914 kg)    HISTORY: OB History  Gravida Para Term Preterm AB SAB TAB Ectopic Multiple Living  4 2 2  0 1 1 0 0 0 2    # Outcome Date GA Lbr Len/2nd Weight Sex Delivery Anes PTL Lv  4 Current           3 Term 02/14/13 6490w4d 05:48 / 00:50 7 lb 15.7 oz (3.62 kg) F Vag-Spont Local  Y     Comments: WNL  2 SAB 05/06/12 4164w2d            Comments: System Generated. Please review and update pregnancy details.  1 Term 01/27/11 256w0d   F Vag-Vacuum None N      Comments: 4th degree     Past Medical History  Diagnosis Date  . No pertinent past medical history   . Medical history non-contributory    Past Surgical History  Procedure Laterality Date  . No past surgeries     History reviewed. No pertinent family history.   Exam    Uterus:     Pelvic Exam:    Perineum: No Hemorrhoids   Vulva:    Vagina:     pH:    Cervix:    Adnexa:    Bony Pelvis:   System: Breast:     Skin: normal coloration and turgor, no rashes    Neurologic: oriented, normal   Extremities: normal strength, tone, and muscle mass   HEENT    Mouth/Teeth  mucous membranes moist, pharynx normal without lesions   Neck supple and no masses   Cardiovascular: regular rate and rhythm   Respiratory:  appears well, vitals normal, no respiratory distress, acyanotic, normal RR, ear and throat exam is normal, neck free of mass or lymphadenopathy, chest clear, no wheezing, crepitations, rhonchi, normal symmetric air entry   Abdomen: soft, non-tender; bowel sounds normal; no masses,  no organomegaly  Urinary:       Assessment:    Pregnancy: [id number] There are no active problems to display for this patient. AMA Late to Care @ 21+3       Plan:     Initial labs drawn. Prenatal vitamins. Problem list reviewed and updated. Genetic Screening discussed : declined.  Ultrasound discussed; fetal survey: requested.  Follow up in 4 weeks. 50 % of 30 min visit spent on counseling and coordination of care.   Schedule Anatomy U/s  Erin Buckley 09/24/2015

## 2015-09-25 LAB — PRESCRIPTION MONITORING PROFILE (19 PANEL)
Amphetamine/Meth: NEGATIVE ng/mL
Barbiturate Screen, Urine: NEGATIVE ng/mL
Benzodiazepine Screen, Urine: NEGATIVE ng/mL
Buprenorphine, Urine: NEGATIVE ng/mL
Cannabinoid Scrn, Ur: NEGATIVE ng/mL
Carisoprodol, Urine: NEGATIVE ng/mL
Cocaine Metabolites: NEGATIVE ng/mL
Creatinine, Urine: 72.71 mg/dL (ref >20.0)
Fentanyl, Ur: NEGATIVE ng/mL
MDMA URINE: NEGATIVE ng/mL
Meperidine, Ur: NEGATIVE ng/mL
Methadone Screen, Urine: NEGATIVE ng/mL
Methaqualone: NEGATIVE ng/mL
Nitrites, Initial: NEGATIVE ug/mL
Opiate Screen, Urine: NEGATIVE ng/mL
Oxycodone Screen, Ur: NEGATIVE ng/mL
Phencyclidine, Ur: NEGATIVE ng/mL
Propoxyphene: NEGATIVE ng/mL
Tapentadol, urine: NEGATIVE ng/mL
Tramadol Scrn, Ur: NEGATIVE ng/mL
Zolpidem, Urine: NEGATIVE ng/mL
pH, Initial: 6 pH (ref 4.5–8.9)

## 2015-09-25 LAB — PRENATAL PROFILE (SOLSTAS)
Antibody Screen: NEGATIVE
Basophils Absolute: 0 10*3/uL (ref 0.0–0.1)
Basophils Relative: 0 % (ref 0–1)
Eosinophils Absolute: 0.2 10*3/uL (ref 0.0–0.7)
Eosinophils Relative: 3 % (ref 0–5)
HCT: 34.2 % — ABNORMAL LOW (ref 36.0–46.0)
HIV 1&2 Ab, 4th Generation: NONREACTIVE
Hemoglobin: 11.6 g/dL — ABNORMAL LOW (ref 12.0–15.0)
Hepatitis B Surface Ag: NEGATIVE
Lymphocytes Relative: 12 % (ref 12–46)
Lymphs Abs: 0.9 10*3/uL (ref 0.7–4.0)
MCH: 30.4 pg (ref 26.0–34.0)
MCHC: 33.9 g/dL (ref 30.0–36.0)
MCV: 89.8 fL (ref 78.0–100.0)
MPV: 9.6 fL (ref 8.6–12.4)
Monocytes Absolute: 0.6 10*3/uL (ref 0.1–1.0)
Monocytes Relative: 8 % (ref 3–12)
Neutro Abs: 6.1 10*3/uL (ref 1.7–7.7)
Neutrophils Relative %: 77 % (ref 43–77)
Platelets: 329 10*3/uL (ref 150–400)
RBC: 3.81 MIL/uL — ABNORMAL LOW (ref 3.87–5.11)
RDW: 13.5 % (ref 11.5–15.5)
Rh Type: POSITIVE
Rubella: 11.3 Index — ABNORMAL HIGH (ref <0.90)
WBC: 7.9 10*3/uL (ref 4.0–10.5)

## 2015-09-25 LAB — GC/CHLAMYDIA PROBE AMP (~~LOC~~) NOT AT ARMC
Chlamydia: NEGATIVE
Neisseria Gonorrhea: NEGATIVE

## 2015-09-25 LAB — CULTURE, OB URINE: Colony Count: 50000

## 2015-09-25 NOTE — Addendum Note (Signed)
Addended by: Jill SideAY, Melquan Ernsberger L on: 09/25/2015 07:50 AM   Modules accepted: Orders

## 2015-09-30 ENCOUNTER — Other Ambulatory Visit: Payer: Self-pay | Admitting: Certified Nurse Midwife

## 2015-09-30 ENCOUNTER — Ambulatory Visit (HOSPITAL_COMMUNITY)
Admission: RE | Admit: 2015-09-30 | Discharge: 2015-09-30 | Disposition: A | Discharge status: Home / Self Care | Payer: Medicaid Other | Source: Ambulatory Visit | Attending: Certified Nurse Midwife | Admitting: Certified Nurse Midwife

## 2015-09-30 DIAGNOSIS — O09522 Supervision of elderly multigravida, second trimester: Secondary | ICD-10-CM

## 2015-09-30 DIAGNOSIS — Z3A22 22 weeks gestation of pregnancy: Secondary | ICD-10-CM | POA: Diagnosis not present

## 2015-09-30 DIAGNOSIS — Z3689 Encounter for other specified antenatal screening: Secondary | ICD-10-CM

## 2015-09-30 DIAGNOSIS — O0932 Supervision of pregnancy with insufficient antenatal care, second trimester: Secondary | ICD-10-CM

## 2015-09-30 DIAGNOSIS — Z36 Encounter for antenatal screening of mother: Secondary | ICD-10-CM | POA: Diagnosis not present

## 2015-10-13 ENCOUNTER — Encounter (HOSPITAL_COMMUNITY): Payer: Self-pay | Admitting: *Deleted

## 2015-10-13 ENCOUNTER — Inpatient Hospital Stay (HOSPITAL_COMMUNITY)
Admission: AD | Admit: 2015-10-13 | Discharge: 2015-10-13 | Disposition: A | Discharge status: Home / Self Care | Payer: Medicaid Other | Source: Ambulatory Visit | Attending: Obstetrics & Gynecology | Admitting: Obstetrics & Gynecology

## 2015-10-13 DIAGNOSIS — Z3A24 24 weeks gestation of pregnancy: Secondary | ICD-10-CM | POA: Diagnosis not present

## 2015-10-13 DIAGNOSIS — O368120 Decreased fetal movements, second trimester, not applicable or unspecified: Secondary | ICD-10-CM | POA: Insufficient documentation

## 2015-10-13 DIAGNOSIS — O368190 Decreased fetal movements, unspecified trimester, not applicable or unspecified: Secondary | ICD-10-CM

## 2015-10-13 DIAGNOSIS — O368191 Decreased fetal movements, unspecified trimester, fetus 1: Secondary | ICD-10-CM

## 2015-10-13 NOTE — MAU Provider Note (Signed)
History   G4P2012 at 24.1 wks in with c/o decreased fetal movement. Since arrival on unit and being placed on EFM fetal movement is now noted.  CSN: [id number]  Arrival date & time 10/13/15  1002   First Provider Initiated Contact with Patient 10/13/15 1113      Chief Complaint  Patient presents with  . Decreased Fetal Movement  . Vaginal Bleeding    HPI  Past Medical History  Diagnosis Date  . No pertinent past medical history   . Medical history non-contributory     Past Surgical History  Procedure Laterality Date  . No past surgeries      History reviewed. No pertinent family history.  Social History  Substance Use Topics  . Smoking status: Never Smoker   . Smokeless tobacco: Never Used  . Alcohol Use: No    OB History    Gravida Para Term Preterm AB TAB SAB Ectopic Multiple Living   4 2 2  0 1 0 1 0 0 2      Review of Systems  Constitutional: Negative.   HENT: Negative.   Eyes: Negative.   Respiratory: Negative.   Cardiovascular: Negative.   Gastrointestinal: Negative.   Endocrine: Negative.   Genitourinary: Negative.   Musculoskeletal: Negative.   Skin: Negative.   Allergic/Immunologic: Negative.   Neurological: Negative.   Hematological: Negative.     Allergies  Review of patient's allergies indicates no known allergies.  Home Medications  No current outpatient prescriptions on file.  BP 119/70 mmHg  Pulse 85  Temp(Src) 98.1 F (36.7 C) (Oral)  Resp 18  Wt 138 lb 11 oz (62.908 kg)  LMP 04/27/2015  Physical Exam  Constitutional: She is oriented to person, place, and time. She appears well-developed and well-nourished.  HENT:  Head: Normocephalic.  Eyes: Pupils are equal, round, and reactive to light.  Neck: Normal range of motion.  Cardiovascular: Normal rate, regular rhythm, normal heart sounds and intact distal pulses.   Pulmonary/Chest: Effort normal and breath sounds normal.  Abdominal: Soft.  Genitourinary: Vagina normal and  uterus normal.  Musculoskeletal: Normal range of motion.  Neurological: She is alert and oriented to person, place, and time. She has normal reflexes.  Skin: Skin is warm and dry.  Psychiatric: She has a normal mood and affect. Her behavior is normal. Judgment and thought content normal.    MAU Course  Procedures (including critical care time)  Labs Reviewed - No data to display No results found.   No diagnosis found.    MDM  SVE cl/th/post high. No vag bleeding or discharge. Will d/c home

## 2015-10-13 NOTE — MAU Note (Signed)
No movement this morning.  Small amt of bleeding when went to bathroom. Very scared.  No pain

## 2015-10-13 NOTE — MAU Note (Signed)
Urine in lab 

## 2015-10-13 NOTE — Discharge Instructions (Signed)
Fetal Movement Counts  Patient Name: __________________________________________________ Patient Due Date: ____________________  Performing a fetal movement count is highly recommended in high-risk pregnancies, but it is good for every pregnant woman to do. Your health care provider may ask you to start counting fetal movements at 28 weeks of the pregnancy. Fetal movements often increase:  · After eating a full meal.  · After physical activity.  · After eating or drinking something sweet or cold.  · At rest.  Pay attention to when you feel the baby is most active. This will help you notice a pattern of your baby's sleep and wake cycles and what factors contribute to an increase in fetal movement. It is important to perform a fetal movement count at the same time each day when your baby is normally most active.   HOW TO COUNT FETAL MOVEMENTS  1. Find a quiet and comfortable area to sit or lie down on your left side. Lying on your left side provides the best blood and oxygen circulation to your baby.  2. Write down the day and time on a sheet of paper or in a journal.  3. Start counting kicks, flutters, swishes, rolls, or jabs in a 2-hour period. You should feel at least 10 movements within 2 hours.  4. If you do not feel 10 movements in 2 hours, wait 2-3 hours and count again. Look for a change in the pattern or not enough counts in 2 hours.  SEEK MEDICAL CARE IF:  · You feel less than 10 counts in 2 hours, tried twice.  · There is no movement in over an hour.  · The pattern is changing or taking longer each day to reach 10 counts in 2 hours.  · You feel the baby is not moving as he or she usually does.  Date: ____________ Movements: ____________ Start time: ____________ Finish time: ____________   Date: ____________ Movements: ____________ Start time: ____________ Finish time: ____________  Date: ____________ Movements: ____________ Start time: ____________ Finish time: ____________  Date: ____________ Movements:  ____________ Start time: ____________ Finish time: ____________  Date: ____________ Movements: ____________ Start time: ____________ Finish time: ____________  Date: ____________ Movements: ____________ Start time: ____________ Finish time: ____________  Date: ____________ Movements: ____________ Start time: ____________ Finish time: ____________  Date: ____________ Movements: ____________ Start time: ____________ Finish time: ____________   Date: ____________ Movements: ____________ Start time: ____________ Finish time: ____________  Date: ____________ Movements: ____________ Start time: ____________ Finish time: ____________  Date: ____________ Movements: ____________ Start time: ____________ Finish time: ____________  Date: ____________ Movements: ____________ Start time: ____________ Finish time: ____________  Date: ____________ Movements: ____________ Start time: ____________ Finish time: ____________  Date: ____________ Movements: ____________ Start time: ____________ Finish time: ____________  Date: ____________ Movements: ____________ Start time: ____________ Finish time: ____________   Date: ____________ Movements: ____________ Start time: ____________ Finish time: ____________  Date: ____________ Movements: ____________ Start time: ____________ Finish time: ____________  Date: ____________ Movements: ____________ Start time: ____________ Finish time: ____________  Date: ____________ Movements: ____________ Start time: ____________ Finish time: ____________  Date: ____________ Movements: ____________ Start time: ____________ Finish time: ____________  Date: ____________ Movements: ____________ Start time: ____________ Finish time: ____________  Date: ____________ Movements: ____________ Start time: ____________ Finish time: ____________   Date: ____________ Movements: ____________ Start time: ____________ Finish time: ____________  Date: ____________ Movements: ____________ Start time: ____________ Finish  time: ____________  Date: ____________ Movements: ____________ Start time: ____________ Finish time: ____________  Date: ____________ Movements: ____________ Start time:   ____________ Finish time: ____________  Date: ____________ Movements: ____________ Start time: ____________ Finish time: ____________  Date: ____________ Movements: ____________ Start time: ____________ Finish time: ____________  Date: ____________ Movements: ____________ Start time: ____________ Finish time: ____________   Date: ____________ Movements: ____________ Start time: ____________ Finish time: ____________  Date: ____________ Movements: ____________ Start time: ____________ Finish time: ____________  Date: ____________ Movements: ____________ Start time: ____________ Finish time: ____________  Date: ____________ Movements: ____________ Start time: ____________ Finish time: ____________  Date: ____________ Movements: ____________ Start time: ____________ Finish time: ____________  Date: ____________ Movements: ____________ Start time: ____________ Finish time: ____________  Date: ____________ Movements: ____________ Start time: ____________ Finish time: ____________   Date: ____________ Movements: ____________ Start time: ____________ Finish time: ____________  Date: ____________ Movements: ____________ Start time: ____________ Finish time: ____________  Date: ____________ Movements: ____________ Start time: ____________ Finish time: ____________  Date: ____________ Movements: ____________ Start time: ____________ Finish time: ____________  Date: ____________ Movements: ____________ Start time: ____________ Finish time: ____________  Date: ____________ Movements: ____________ Start time: ____________ Finish time: ____________  Date: ____________ Movements: ____________ Start time: ____________ Finish time: ____________   Date: ____________ Movements: ____________ Start time: ____________ Finish time: ____________  Date: ____________  Movements: ____________ Start time: ____________ Finish time: ____________  Date: ____________ Movements: ____________ Start time: ____________ Finish time: ____________  Date: ____________ Movements: ____________ Start time: ____________ Finish time: ____________  Date: ____________ Movements: ____________ Start time: ____________ Finish time: ____________  Date: ____________ Movements: ____________ Start time: ____________ Finish time: ____________  Date: ____________ Movements: ____________ Start time: ____________ Finish time: ____________   Date: ____________ Movements: ____________ Start time: ____________ Finish time: ____________  Date: ____________ Movements: ____________ Start time: ____________ Finish time: ____________  Date: ____________ Movements: ____________ Start time: ____________ Finish time: ____________  Date: ____________ Movements: ____________ Start time: ____________ Finish time: ____________  Date: ____________ Movements: ____________ Start time: ____________ Finish time: ____________  Date: ____________ Movements: ____________ Start time: ____________ Finish time: ____________     This information is not intended to replace advice given to you by your health care provider. Make sure you discuss any questions you have with your health care provider.     Document Released: 11/25/2006 Document Revised: 11/16/2014 Document Reviewed: 08/22/2012  Elsevier Interactive Patient Education ©2016 [employer]

## 2015-10-22 ENCOUNTER — Encounter: Payer: Medicaid Other | Admitting: Certified Nurse Midwife

## 2015-10-23 ENCOUNTER — Ambulatory Visit (INDEPENDENT_AMBULATORY_CARE_PROVIDER_SITE_OTHER): Payer: Medicaid Other | Admitting: Certified Nurse Midwife

## 2015-10-23 VITALS — BP 117/71 | HR 99 | Temp 98.2°F | Wt 143.4 lb

## 2015-10-23 DIAGNOSIS — O09522 Supervision of elderly multigravida, second trimester: Secondary | ICD-10-CM | POA: Insufficient documentation

## 2015-10-23 LAB — POCT URINALYSIS DIP (DEVICE)
Bilirubin Urine: NEGATIVE
Glucose, UA: NEGATIVE mg/dL
Hgb urine dipstick: NEGATIVE
Ketones, ur: NEGATIVE mg/dL
Nitrite: NEGATIVE
Protein, ur: NEGATIVE mg/dL
Specific Gravity, Urine: <=1.005 (ref 1.005–1.030)
Urobilinogen, UA: 0.2 mg/dL (ref 0.0–1.0)
pH: 7 (ref 5.0–8.0)

## 2015-10-23 NOTE — Progress Notes (Signed)
Breastfeeding tip of the week reviewed Interpreter present 

## 2015-10-23 NOTE — Progress Notes (Signed)
Subjective:  Erin Buckley is a 44 y.o. Z6X0960G4P2012 at 4740w4d being seen today for ongoing prenatal care.  She is currently monitored for the following issues for this high-risk pregnancy and has Supervision of high risk elderly multigravida in second trimester on her problem list.  Patient reports no complaints.  Contractions: Irritability. Vag. Bleeding: None.  Movement: Present. Denies leaking of fluid.   The following portions of the patient's history were reviewed and updated as appropriate: allergies, current medications, past family history, past medical history, past social history, past surgical history and problem list. Problem list updated.  Objective:   Filed Vitals:   10/23/15 1100  BP: 117/71  Pulse: 99  Temp: 98.2 F (36.8 C)  Weight: 143 lb 6.4 oz (65.046 kg)    Fetal Status: Fetal Heart Rate (bpm): 155 Fundal Height: 26 cm Movement: Present     General:  Alert, oriented and cooperative. Patient is in no acute distress.  Skin: Skin is warm and dry. No rash noted.   Cardiovascular: Normal heart rate noted  Respiratory: Normal respiratory effort, no problems with respiration noted  Abdomen: Soft, gravid, appropriate for gestational age. Pain/Pressure: Present     Pelvic: Vag. Bleeding: None     Cervical exam deferred        Extremities: Normal range of motion.  Edema: None  Mental Status: Normal mood and affect. Normal behavior. Normal judgment and thought content.   Urinalysis: Urine Protein: Negative Urine Glucose: Negative  Assessment and Plan:  Pregnancy: A5W0981G4P2012 at 4940w4d  1. Supervision of high risk elderly multigravida in second trimester   Preterm labor symptoms and general obstetric precautions including but not limited to vaginal bleeding, contractions, leaking of fluid and fetal movement were reviewed in detail with the patient. Please refer to After Visit Summary for other counseling recommendations.  Return in about 2 weeks (around 11/06/2015) for 1 hour  gtt.   Rhea PinkLori A Clemmons, CNM

## 2015-10-23 NOTE — Patient Instructions (Signed)
Glucose Tolerance Test During Pregnancy The glucose tolerance test (GTT) is a blood test used to determine if you have developed a type of diabetes during pregnancy (gestational diabetes). This is when your body does not properly process sugar (glucose) in the food you eat, resulting in high blood glucose levels. Typically, a GTT is done after you have had a 1-hour glucose test with results that indicate you possibly have gestational diabetes. It may also be done if:  You have a history of giving birth to very large babies or have experienced repeated fetal loss (stillbirth).   You have signs and symptoms of diabetes, such as:   Changes in your vision.   Tingling or numbness in your hands or feet.   Changes in hunger, thirst, and urination not otherwise explained by your pregnancy.  The GTT lasts about 3 hours. You will be given a sugar-water solution to drink at the beginning of the test. You will have blood drawn before you drink the solution and then again 1, 2, and 3 hours after you drink it. You will not be allowed to eat or drink anything else during the test. You must remain at the testing location to make sure that your blood is drawn on time. You should also avoid exercising during the test, because exercise can alter test results. PREPARATION FOR TEST  Eat normally for 3 days prior to the GTT test, including having plenty of carbohydrate-rich foods. Do not eat or drink anything except water during the final 12 hours before the test. In addition, your health care provider may ask you to stop taking certain medicines before the test. RESULTS  It is your responsibility to obtain your test results. Ask the lab or department performing the test when and how you will get your results. Contact your health care provider to discuss any questions you have about your results.  Range of Normal Values Ranges for normal values may vary among different labs and hospitals. You should always check  with your health care provider after having lab work or other tests done to discuss whether your values are considered within normal limits. Normal levels of blood glucose are as follows:  Fasting: less than 105 mg/dL.   1 hour after drinking the solution: less than 190 mg/dL.   2 hours after drinking the solution: less than 165 mg/dL.   3 hours after drinking the solution: less than 145 mg/dL.  Some substances can interfere with GTT results. These may include:  Blood pressure and heart failure medicines, including beta blockers, furosemide, and thiazides.   Anti-inflammatory medicines, including aspirin.   Nicotine.   Some psychiatric medicines.  Meaning of Results Outside Normal Value Ranges GTT test results that are above normal values may indicate a number of health problems, such as:   Gestational diabetes.   Acute stress response.   Cushing syndrome.   Tumors such as pheochromocytoma or glucagonoma.   Long-term kidney problems.   Pancreatitis.   Hyperthyroidism.   Current infection.  Discuss your test results with your health care provider. He or she will use the results to make a diagnosis and determine a treatment plan that is right for you.   This information is not intended to replace advice given to you by your health care provider. Make sure you discuss any questions you have with your health care provider.   Document Released: 04/26/2012 Document Revised: 11/16/2014 Document Reviewed: 03/02/2014 Elsevier Interactive Patient Education 2016 Elsevier Inc.  

## 2015-11-06 ENCOUNTER — Encounter: Payer: Medicaid Other | Admitting: Obstetrics and Gynecology

## 2015-11-10 NOTE — L&D Delivery Note (Signed)
Patient is 45 y.o. [id number] 6062w5d admitted for active labor   Delivery Note At 9:42 PM a viable female was delivered via Vaginal, Spontaneous Delivery (Presentation: ; Occiput Anterior).  APGAR: 9, 9; weight pending  .   Placenta status: Intact, Spontaneous.  Cord: 3 vessels with the following complications: None.  Cord pH: n/a  Anesthesia: None  Episiotomy: None Lacerations: 2nd degree;Vaginal Suture Repair: 2.0 vicryl Est. Blood Loss (mL):  100 cc  Mom to postpartum.  Baby to Couplet care / Skin to Skin.  Beaulah DinningChristina M Gambino 01/23/2016, 10:21 PM   I was with this resident for the above and agree.

## 2015-11-13 ENCOUNTER — Encounter: Payer: Self-pay | Admitting: Certified Nurse Midwife

## 2015-11-13 ENCOUNTER — Ambulatory Visit (INDEPENDENT_AMBULATORY_CARE_PROVIDER_SITE_OTHER): Payer: Medicaid Other | Admitting: Certified Nurse Midwife

## 2015-11-13 VITALS — BP 105/70 | HR 87 | Temp 98.5°F | Wt 142.0 lb

## 2015-11-13 DIAGNOSIS — O09522 Supervision of elderly multigravida, second trimester: Secondary | ICD-10-CM

## 2015-11-13 DIAGNOSIS — Z23 Encounter for immunization: Secondary | ICD-10-CM | POA: Diagnosis not present

## 2015-11-13 LAB — POCT URINALYSIS DIP (DEVICE)
BILIRUBIN URINE: NEGATIVE
Glucose, UA: NEGATIVE mg/dL
KETONES UR: NEGATIVE mg/dL
NITRITE: POSITIVE — AB
PH: 6.5 (ref 5.0–8.0)
Protein, ur: 30 mg/dL — AB
Specific Gravity, Urine: 1.02 (ref 1.005–1.030)
Urobilinogen, UA: 0.2 mg/dL (ref 0.0–1.0)

## 2015-11-13 LAB — CBC
HCT: 30.6 % — ABNORMAL LOW (ref 36.0–46.0)
Hemoglobin: 10.7 g/dL — ABNORMAL LOW (ref 12.0–15.0)
MCH: 30.2 pg (ref 26.0–34.0)
MCHC: 35 g/dL (ref 30.0–36.0)
MCV: 86.4 fL (ref 78.0–100.0)
MPV: 9.6 fL (ref 8.6–12.4)
Platelets: 306 10*3/uL (ref 150–400)
RBC: 3.54 MIL/uL — ABNORMAL LOW (ref 3.87–5.11)
RDW: 13.4 % (ref 11.5–15.5)
WBC: 6.2 10*3/uL (ref 4.0–10.5)

## 2015-11-13 MED ORDER — PRENATAL VITAMINS 0.8 MG PO TABS: 1.0000 | ORAL_TABLET | Freq: Every day | ORAL | Status: DC

## 2015-11-13 MED ORDER — TETANUS-DIPHTH-ACELL PERTUSSIS 5-2.5-18.5 LF-MCG/0.5 IM SUSP
0.5000 mL | Freq: Once | INTRAMUSCULAR | Status: AC
  Administered 2015-11-13: 0.5 mL via INTRAMUSCULAR

## 2015-11-13 NOTE — Progress Notes (Signed)
            Advanced maternal Age > 43 y.o. - 659.63 20-24-28-32-36 36//2 x wk 40   Subjective:  Erin Buckley is a 45 y.o. [id number] at 357w4d being seen today for ongoing prenatal care.  She is currently monitored for the following issues for this high-risk pregnancy and has Supervision of high risk elderly multigravida in second trimester on her problem list.  Patient reports no complaints.  Contractions: Not present. Vag. Bleeding: None.  Movement: Present. Denies leaking of fluid.   The following portions of the patient's history were reviewed and updated as appropriate: allergies, current medications, past family history, past medical history, past social history, past surgical history and problem list. Problem list updated.  Objective:   Filed Vitals:   11/13/15 1108  BP: 105/70  Pulse: 87  Temp: 98.5 F (36.9 C)  Weight: 142 lb (64.411 kg)    Fetal Status: Fetal Heart Rate (bpm): 154   Movement: Present     General:  Alert, oriented and cooperative. Patient is in no acute distress.  Skin: Skin is warm and dry. No rash noted.   Cardiovascular: Normal heart rate noted  Respiratory: Normal respiratory effort, no problems with respiration noted  Abdomen: Soft, gravid, appropriate for gestational age. Pain/Pressure: Present     Pelvic: Vag. Bleeding: None     Cervical exam deferred        Extremities: Normal range of motion.  Edema: None  Mental Status: Normal mood and affect. Normal behavior. Normal judgment and thought content.   Urinalysis: Urine Protein: 1+ Urine Glucose: Negative  Assessment and Plan:  Pregnancy: [id number] at 877w4d  1. Supervision of high risk elderly multigravida in second trimester  - Prenatal Multivit-Min-Fe-FA (PRENATAL VITAMINS) 0.8 MG tablet; Take 1 tablet by mouth daily.  Dispense: 30 tablet; Refill: 12 - CBC - RPR - HIV antibody (with reflex) - Glucose Tolerance, 1 HR (50g) w/o Fasting - Tdap (BOOSTRIX) injection 0.5 mL; Inject 0.5 mLs into the  muscle once.  Preterm labor symptoms and general obstetric precautions including but not limited to vaginal bleeding, contractions, leaking of fluid and fetal movement were reviewed in detail with the patient. Please refer to After Visit Summary for other counseling recommendations.  No Follow-up on file.   Rhea PinkLori A Clemmons, CNM

## 2015-11-13 NOTE — Patient Instructions (Signed)

## 2015-11-13 NOTE — Progress Notes (Signed)
Growth US scheduled for January 18th @ 0930. Pt notified.

## 2015-11-13 NOTE — Assessment & Plan Note (Signed)
Advanced maternal Age > 43 y.o. - 659.63 U/S 20-24-28-32-36 NST 36//2 x wk Deliver 40

## 2015-11-13 NOTE — Progress Notes (Signed)
Wier used for interpreter

## 2015-11-14 LAB — HIV ANTIBODY (ROUTINE TESTING W REFLEX): HIV 1&2 Ab, 4th Generation: NONREACTIVE

## 2015-11-14 LAB — GLUCOSE TOLERANCE, 1 HOUR (50G) W/O FASTING: Glucose, 1 Hour GTT: 130 mg/dL (ref 70–140)

## 2015-11-15 LAB — RPR

## 2015-11-27 ENCOUNTER — Ambulatory Visit (HOSPITAL_COMMUNITY)
Admission: RE | Admit: 2015-11-27 | Discharge: 2015-11-27 | Disposition: A | Discharge status: Home / Self Care | Payer: Medicaid Other | Source: Ambulatory Visit | Attending: Certified Nurse Midwife | Admitting: Certified Nurse Midwife

## 2015-11-27 ENCOUNTER — Ambulatory Visit (INDEPENDENT_AMBULATORY_CARE_PROVIDER_SITE_OTHER): Payer: Medicaid Other | Admitting: Family

## 2015-11-27 VITALS — BP 116/72 | HR 87 | Temp 98.2°F | Wt 145.8 lb

## 2015-11-27 DIAGNOSIS — O09523 Supervision of elderly multigravida, third trimester: Secondary | ICD-10-CM | POA: Diagnosis not present

## 2015-11-27 DIAGNOSIS — O09522 Supervision of elderly multigravida, second trimester: Secondary | ICD-10-CM

## 2015-11-27 DIAGNOSIS — O09529 Supervision of elderly multigravida, unspecified trimester: Secondary | ICD-10-CM | POA: Insufficient documentation

## 2015-11-27 DIAGNOSIS — O09513 Supervision of elderly primigravida, third trimester: Secondary | ICD-10-CM

## 2015-11-27 LAB — POCT URINALYSIS DIP (DEVICE)
Bilirubin Urine: NEGATIVE
GLUCOSE, UA: NEGATIVE mg/dL
Hgb urine dipstick: NEGATIVE
Ketones, ur: NEGATIVE mg/dL
Nitrite: NEGATIVE
PROTEIN: NEGATIVE mg/dL
SPECIFIC GRAVITY, URINE: 1.015 (ref 1.005–1.030)
UROBILINOGEN UA: 0.2 mg/dL (ref 0.0–1.0)
pH: 7 (ref 5.0–8.0)

## 2015-11-27 NOTE — Progress Notes (Signed)
Subjective:  Taylr Rzasa is a 45 y.o. [id number] at [redacted]w[redacted]d being seen today for ongoing prenatal care.  She is currently monitored for the following issues for this high-risk pregnancy and has Supervision of high risk elderly multigravida in second trimester and Advanced maternal age, primigravida, antepartum on her problem list.  Patient reports no complaints.  Contractions: Not present. Vag. Bleeding: None.  Movement: Present. Denies leaking of fluid.   The following portions of the patient's history were reviewed and updated as appropriate: allergies, current medications, past family history, past medical history, past social history, past surgical history and problem list. Problem list updated.  Objective:   Filed Vitals:   11/27/15 1101  BP: 116/72  Pulse: 87  Temp: 98.2 F (36.8 C)  Weight: 145 lb 12.8 oz (66.134 kg)    Fetal Status: Fetal Heart Rate (bpm): 140 Fundal Height: 30 cm Movement: Present     General:  Alert, oriented and cooperative. Patient is in no acute distress.  Skin: Skin is warm and dry. No rash noted.   Cardiovascular: Normal heart rate noted  Respiratory: Normal respiratory effort, no problems with respiration noted  Abdomen: Soft, gravid, appropriate for gestational age. Pain/Pressure: Present     Pelvic: Vag. Bleeding: None     Cervical exam deferred        Extremities: Normal range of motion.  Edema: None  Mental Status: Normal mood and affect. Normal behavior. Normal judgment and thought content.   Urinalysis: Urine Protein: Negative Urine Glucose: Negative  Assessment and Plan:  Pregnancy: [id number] at [redacted]w[redacted]d  1. Advanced maternal age, antepartum, third trimester - Growth ultrasound this morning (results not available) - Discussed need of fetal testing beginning at 36 wks or sooner if medically indicated  Preterm labor symptoms and general obstetric precautions including but not limited to vaginal bleeding, contractions, leaking of fluid and fetal movement  were reviewed in detail with the patient. Please refer to After Visit Summary for other counseling recommendations.  Return in about 2 weeks (around 12/11/2015).   Eino Farber Kennith Gain, CNM

## 2015-12-11 ENCOUNTER — Ambulatory Visit (INDEPENDENT_AMBULATORY_CARE_PROVIDER_SITE_OTHER): Payer: Medicaid Other | Admitting: Certified Nurse Midwife

## 2015-12-11 VITALS — BP 128/72 | HR 100 | Temp 98.5°F | Wt 148.0 lb

## 2015-12-11 DIAGNOSIS — O09523 Supervision of elderly multigravida, third trimester: Secondary | ICD-10-CM

## 2015-12-11 DIAGNOSIS — O09513 Supervision of elderly primigravida, third trimester: Secondary | ICD-10-CM

## 2015-12-11 DIAGNOSIS — O09522 Supervision of elderly multigravida, second trimester: Secondary | ICD-10-CM

## 2015-12-11 LAB — POCT URINALYSIS DIP (DEVICE)
Bilirubin Urine: NEGATIVE
Glucose, UA: 100 mg/dL — AB
Hgb urine dipstick: NEGATIVE
Ketones, ur: NEGATIVE mg/dL
Leukocytes, UA: NEGATIVE
Nitrite: NEGATIVE
Protein, ur: NEGATIVE mg/dL
Specific Gravity, Urine: 1.01 (ref 1.005–1.030)
Urobilinogen, UA: 0.2 mg/dL (ref 0.0–1.0)
pH: 6.5 (ref 5.0–8.0)

## 2015-12-11 NOTE — Patient Instructions (Signed)

## 2015-12-11 NOTE — Progress Notes (Signed)
Reviewed tip of week with patient  Erin Buckley used for interpreter

## 2015-12-11 NOTE — Progress Notes (Signed)
Subjective:  Marchetta Bonser is a 45 y.o. [id number] at [redacted]w[redacted]d being seen today for ongoing prenatal care.  She is currently monitored for the following issues for this high-risk pregnancy and has Supervision of high risk elderly multigravida in second trimester and Advanced maternal age, primigravida, antepartum on her problem list.  Patient reports no complaints.  Contractions: Not present. Vag. Bleeding: None.  Movement: Present. Denies leaking of fluid.   The following portions of the patient's history were reviewed and updated as appropriate: allergies, current medications, past family history, past medical history, past social history, past surgical history and problem list. Problem list updated.  Objective:   Filed Vitals:   12/11/15 1042  BP: 128/72  Pulse: 100  Temp: 98.5 F (36.9 C)  Weight: 148 lb (67.132 kg)    Fetal Status: Fetal Heart Rate (bpm): 143   Movement: Present     General:  Alert, oriented and cooperative. Patient is in no acute distress.  Skin: Skin is warm and dry. No rash noted.   Cardiovascular: Normal heart rate noted  Respiratory: Normal respiratory effort, no problems with respiration noted  Abdomen: Soft, gravid, appropriate for gestational age. Pain/Pressure: Present     Pelvic: Vag. Bleeding: None     Cervical exam deferred        Extremities: Normal range of motion.  Edema: None  Mental Status: Normal mood and affect. Normal behavior. Normal judgment and thought content.   Urinalysis:      Assessment and Plan:  Pregnancy: [id number] at [redacted]w[redacted]d  1. Advanced maternal age, primigravida, antepartum, third trimester   2. Supervision of high risk elderly multigravida in second trimester Ultrasound scheduled for AMA- Had an ultrasound At 30 weeks and will repeat one @ 34 weeks  Preterm labor symptoms and general obstetric precautions including but not limited to vaginal bleeding, contractions, leaking of fluid and fetal movement were reviewed in detail with the  patient. Please refer to After Visit Summary for other counseling recommendations.  Return in about 2 weeks (around 12/25/2015).   Rhea Pink, CNM

## 2015-12-25 ENCOUNTER — Ambulatory Visit (INDEPENDENT_AMBULATORY_CARE_PROVIDER_SITE_OTHER): Payer: Medicaid Other | Admitting: Advanced Practice Midwife

## 2015-12-25 ENCOUNTER — Ambulatory Visit (HOSPITAL_COMMUNITY)
Admission: RE | Admit: 2015-12-25 | Discharge: 2015-12-25 | Disposition: A | Discharge status: Home / Self Care | Payer: Medicaid Other | Source: Ambulatory Visit | Attending: Certified Nurse Midwife | Admitting: Certified Nurse Midwife

## 2015-12-25 VITALS — BP 127/67 | HR 101 | Temp 98.2°F | Wt 150.3 lb

## 2015-12-25 DIAGNOSIS — O09522 Supervision of elderly multigravida, second trimester: Secondary | ICD-10-CM

## 2015-12-25 DIAGNOSIS — O09513 Supervision of elderly primigravida, third trimester: Secondary | ICD-10-CM

## 2015-12-25 DIAGNOSIS — Z3A34 34 weeks gestation of pregnancy: Secondary | ICD-10-CM | POA: Diagnosis not present

## 2015-12-25 DIAGNOSIS — Z789 Other specified health status: Secondary | ICD-10-CM | POA: Diagnosis not present

## 2015-12-25 DIAGNOSIS — O09529 Supervision of elderly multigravida, unspecified trimester: Secondary | ICD-10-CM

## 2015-12-25 DIAGNOSIS — O09523 Supervision of elderly multigravida, third trimester: Secondary | ICD-10-CM | POA: Insufficient documentation

## 2015-12-25 LAB — POCT URINALYSIS DIP (DEVICE)
Bilirubin Urine: NEGATIVE
GLUCOSE, UA: NEGATIVE mg/dL
Hgb urine dipstick: NEGATIVE
KETONES UR: NEGATIVE mg/dL
NITRITE: NEGATIVE
PH: 6 (ref 5.0–8.0)
PROTEIN: NEGATIVE mg/dL
Specific Gravity, Urine: <=1.005 (ref 1.005–1.030)
UROBILINOGEN UA: 0.2 mg/dL (ref 0.0–1.0)

## 2015-12-25 NOTE — Progress Notes (Signed)
Urine: moderate leukocytes Breastfeeding tip of the week reviewed

## 2015-12-25 NOTE — Patient Instructions (Signed)
C?n co th?t Braxton Hicks (Braxton Hicks Contractions) Co th?t t? cung c th? x?y ra trong su?t thai k?. Co th?t khng ph?i lc no c?ng l d?u hi?u cho th?y qu v? chuy?n d?.  C?N CO TH?T BRAXTON HICKS L G?  C?n co th?t x?y ra tr??c khi sinh ???c g?i l c?n co th?t Braxton Hicks, hay chuy?n d? gi?Marland Kitchen V? cu?i [place] k? (32-24 tu?n), nh?ng c?n co th?t ny x?y ra th??ng xuyn h?n v c th? m?nh h?n. C?n c th?t ny khng ph?i l chuy?n d? th?c s? v chng khng lm m? (gin n?) v lm m?ng c? t? cung. ?i khi c th? kh phn bi?t v?i chuy?n d? th?c s? v nh?ng c?n co th?t ny c th? m?nh v m?i ng??i l?i c s?c ch?u ?au khc nhau. Qu v? ??ng c?m th?y ng??ng ngng n?u qu v? vo b?nh vi?n khi c chuy?n d? gi?. ?i khi, cch duy nh?t ?? bi?t qu v? c chuy?n d? th?t khng l ph?i ?? chuyn gia ch?m [place] s?c kh?e pht hi?n nh?ng thay ??i ? c? t? cung. N?u khng c v?n ?? g tr??c khi sinh ho?c nh?ng v?n ?? s?c kh?e khc lin quan ??n vi?c mang [place], qu v? c th? hon ton an ton tr? v? nh khi chuy?n d? gi? v ??i cho ??n khi c c?n chuy?n d? th?t. QU V? C TH? NH?N BI?T S? [place] GI?A CHUY?N D? TH?T V CHUY?N D? GI? NH? TH? NO? Chuy?n d? gi?  C?n co th?t chuy?n d? gi? th??ng ng?n h?n v khng kh ch?u nh? khi chuy?n d? th?t.  Cc c?n co th?t th??ng khng ??u.  Cc c?n co th?t th??ng ???c c?m nh?n ? ph?n tr??c b?ng d??i v vng b?n.  Cc c?n co th?t c th? h?t khi qu v? ?i l?i ho?c thay ??i t? th? khi n?m.  Cc c?n co th?t s? y?u h?n v ko di trong th?i gian ng?n h?n theo th?i gian.  Cc c?n co th?t th??ng khng ti?n tri?n m?nh ln, ??u, v li?n nhau h?n nh? khi chuy?n d? th?t. Chuy?n d? th?t  C?n co th?t khi chuy?n d? th?t ko di 30-70 giy, tr? nn r?t ??u, th??ng l m?nh ln, v t?ng t?n su?t.  C?n co th?t khng h?t khi qu v? ?i l?i.  C?m gic kh ch?u th??ng c?m th?y ? pha trn t? cung v lan t?i vng b?ng d??i v vng l?ng d??i.  Chuyn gia ch?m [place] s?c kh?e c th? khm ?? xc  ??nh chuy?n d? l th?t. Khi khm s? th?y c? t? cung gin n? v m?ng h?n. C?N NH? ?I?U G  Ti?p t?c cc bi th? d?c thng th??ng c?a qu v? v lm theo cc ch? d?n khc c?a chuyn gia ch?m  s?c kh?e.  S? d?ng thu?c theo ch? d?n c?a chuyn gia ch?m  s?c kh?e.  Ti?p t?c l?ch h?n khm tr??c sinh nh? bnh th??ng.  ?n v u?ng ?? nh? n?u qu v? ngh? qu v? s?p chuy?n d?.  N?u c?n co th?t Braxton Hicks lm qu v? kh ch?u:  Thay ??i t? t? th? n?m ho?c ngh? ng?i sang ?i l?i, ho?c t? ?i l?i sang ngh? ng?i.  Ng?i v ngh? ng?i trong m?t b?n n??c ?m.  U?ng 2-3 c?c n??c. M?t n??c c th? gy ra nh?ng c?n co th?t nh? v?y.  Th? ch?m v su vi l?n trong m?t ti?ng. KHI NO TI C?N ?I  KHÁM NGAY L?P T?C? °Hãy ?i khám ngay l?p t?c n?u: °· Các c?n co th?t tr? nên m?nh h?n, ??u h?n, và g?n nhau h?n. °· Quý v? b? r? d?ch ho?c ti?t d?ch t? âm ??o. °· Quý v? b? s?t. °· Quý v? ra d?ch nh?y có l?n máu. °· Quý v? b? ch?y máu âm ??o. °· Quý v? b? ?au b?ng liên t?c. °· Quý v? b? ?au vùng th?t l?ng mà tr??c ?ây ch?a t?ng b? bao gi?. °· Quý v? c?m th?y ??u c?a con quý v? ??y xu?ng d??i và gây áp l?c lên vùng khung ch?u. °· Con quý v? không c? ??ng nhi?u nh? tr??c. °  °Thông tin này không nh?m m?c ?ích thay th? cho l?i khuyên mà chuyên gia ch?m sóc s?c kh?e nói v?i quý v?. Hãy b?o ??m quý v? ph?i th?o lu?n b?t k? v?n ?? gì mà quý v? có v?i chuyên gia ch?m sóc s?c kh?e c?a quý v?. °  °Document Released: 10/26/2005 Document Revised: 10/31/2013 °Elsevier Interactive Patient Education ©2016 Elsevier Inc. ° °

## 2015-12-25 NOTE — Progress Notes (Signed)
Subjective:  Erin Buckley is a 45 y.o. [id number] at [redacted]w[redacted]d being seen today for ongoing prenatal care.  She is currently monitored for the following issues for this low-risk pregnancy and has Supervision of high risk elderly multigravida in second trimester; Advanced maternal age, primigravida, antepartum; and Language barrier on her problem list.  Patient reports no complaints.  Contractions: Irregular. Vag. Bleeding: None.  Movement: Present. Denies leaking of fluid.   The following portions of the patient's history were reviewed and updated as appropriate: allergies, current medications, past family history, past medical history, past social history, past surgical history and problem list. Problem list updated.  Objective:   Filed Vitals:   12/25/15 1102  BP: 127/67  Pulse: 101  Temp: 98.2 F (36.8 C)  Weight: 150 lb 4.8 oz (68.176 kg)    Fetal Status: Fetal Heart Rate (bpm): 140   Movement: Present     General:  Alert, oriented and cooperative. Patient is in no acute distress.  Skin: Skin is warm and dry. No rash noted.   Cardiovascular: Normal heart rate noted  Respiratory: Normal respiratory effort, no problems with respiration noted  Abdomen: Soft, gravid, appropriate for gestational age. Pain/Pressure: Present     Pelvic: Vag. Bleeding: None     Cervical exam deferred        Extremities: Normal range of motion.  Edema: Trace  Mental Status: Normal mood and affect. Normal behavior. Normal judgment and thought content.   Urinalysis: Urine Protein: Negative Urine Glucose: Negative  Assessment and Plan:  Pregnancy: [id number] at [redacted]w[redacted]d  1. Language barrier  2. Supervision of other High Risk Pregnancy  3. AMA  - Start 2x week testing at 36 weeks.  - Growth today  Preterm labor symptoms and general obstetric precautions including but not limited to vaginal bleeding, contractions, leaking of fluid and fetal movement were reviewed in detail with the patient. Please refer to After  Visit Summary for other counseling recommendations.  Return in about 9 days (around 01/03/2016) for Start twice weekly testing.   Dorathy Kinsman, CNM

## 2016-01-03 ENCOUNTER — Ambulatory Visit (INDEPENDENT_AMBULATORY_CARE_PROVIDER_SITE_OTHER): Payer: Medicaid Other | Admitting: *Deleted

## 2016-01-03 VITALS — BP 111/66 | HR 83

## 2016-01-03 DIAGNOSIS — O09523 Supervision of elderly multigravida, third trimester: Secondary | ICD-10-CM | POA: Diagnosis not present

## 2016-01-03 NOTE — Progress Notes (Signed)
Interpreter Cyd Silence present for encounter.

## 2016-01-03 NOTE — Progress Notes (Signed)
NST reactive.

## 2016-01-06 ENCOUNTER — Ambulatory Visit (INDEPENDENT_AMBULATORY_CARE_PROVIDER_SITE_OTHER): Payer: Medicaid Other | Admitting: *Deleted

## 2016-01-06 VITALS — BP 110/69 | HR 90

## 2016-01-06 DIAGNOSIS — O09523 Supervision of elderly multigravida, third trimester: Secondary | ICD-10-CM | POA: Diagnosis present

## 2016-01-06 NOTE — Progress Notes (Signed)
Interpreter Weir Siu present for encounter.  

## 2016-01-06 NOTE — Progress Notes (Signed)
NST reactive.

## 2016-01-08 ENCOUNTER — Other Ambulatory Visit: Payer: PRIVATE HEALTH INSURANCE

## 2016-01-09 ENCOUNTER — Other Ambulatory Visit: Payer: PRIVATE HEALTH INSURANCE

## 2016-01-13 ENCOUNTER — Ambulatory Visit (INDEPENDENT_AMBULATORY_CARE_PROVIDER_SITE_OTHER): Payer: Medicaid Other | Admitting: *Deleted

## 2016-01-13 VITALS — BP 119/70 | HR 82

## 2016-01-13 DIAGNOSIS — O09529 Supervision of elderly multigravida, unspecified trimester: Secondary | ICD-10-CM

## 2016-01-13 DIAGNOSIS — O09523 Supervision of elderly multigravida, third trimester: Secondary | ICD-10-CM

## 2016-01-13 NOTE — Progress Notes (Signed)
NST reactive.

## 2016-01-16 ENCOUNTER — Other Ambulatory Visit (HOSPITAL_COMMUNITY)
Admission: RE | Admit: 2016-01-16 | Discharge: 2016-01-16 | Disposition: A | Discharge status: Home / Self Care | Payer: Medicaid Other | Source: Ambulatory Visit | Attending: Obstetrics & Gynecology | Admitting: Obstetrics & Gynecology

## 2016-01-16 ENCOUNTER — Ambulatory Visit (INDEPENDENT_AMBULATORY_CARE_PROVIDER_SITE_OTHER): Payer: Medicaid Other | Admitting: Obstetrics & Gynecology

## 2016-01-16 VITALS — BP 123/67 | HR 84 | Wt 153.1 lb

## 2016-01-16 DIAGNOSIS — Z36 Encounter for antenatal screening of mother: Secondary | ICD-10-CM

## 2016-01-16 DIAGNOSIS — O09523 Supervision of elderly multigravida, third trimester: Secondary | ICD-10-CM | POA: Diagnosis present

## 2016-01-16 DIAGNOSIS — O09529 Supervision of elderly multigravida, unspecified trimester: Secondary | ICD-10-CM

## 2016-01-16 DIAGNOSIS — O09522 Supervision of elderly multigravida, second trimester: Secondary | ICD-10-CM

## 2016-01-16 DIAGNOSIS — Z113 Encounter for screening for infections with a predominantly sexual mode of transmission: Secondary | ICD-10-CM | POA: Insufficient documentation

## 2016-01-16 LAB — POCT URINALYSIS DIP (DEVICE)
Bilirubin Urine: NEGATIVE
GLUCOSE, UA: NEGATIVE mg/dL
Hgb urine dipstick: NEGATIVE
Ketones, ur: NEGATIVE mg/dL
Leukocytes, UA: NEGATIVE
NITRITE: NEGATIVE
Protein, ur: NEGATIVE mg/dL
Specific Gravity, Urine: 1.01 (ref 1.005–1.030)
UROBILINOGEN UA: 0.2 mg/dL (ref 0.0–1.0)
pH: 7 (ref 5.0–8.0)

## 2016-01-16 LAB — OB RESULTS CONSOLE GC/CHLAMYDIA: GC PROBE AMP, GENITAL: NEGATIVE

## 2016-01-16 LAB — OB RESULTS CONSOLE GBS: STREP GROUP B AG: NEGATIVE

## 2016-01-16 NOTE — Progress Notes (Signed)
Interpreter Cyd SilenceSnow Rahlan present for encounter.

## 2016-01-16 NOTE — Progress Notes (Signed)
Subjective:  Erin Buckley is a 45 y.o. [id number] at 6157w5d being seen today for ongoing prenatal care.  She is currently monitored for the following issues for this high-risk pregnancy and has Supervision of high risk elderly multigravida in second trimester; Encounter for supervision of high risk multigravida of advanced maternal age, antepartum; and Language barrier on her problem list. Estanislado SpireJarai interpreter present.  Patient reports no complaints.  Contractions: Irregular. Vag. Bleeding: None.  Movement: Present. Denies leaking of fluid.   The following portions of the patient's history were reviewed and updated as appropriate: allergies, current medications, past family history, past medical history, past social history, past surgical history and problem list. Problem list updated.  Objective:   Filed Vitals:   01/16/16 0950  BP: 123/67  Pulse: 84  Weight: 153 lb 1.6 oz (69.446 kg)    Fetal Status: Fetal Heart Rate (bpm): NST Fundal Height: 37 cm Movement: Present  Presentation: Vertex  General:  Alert, oriented and cooperative. Patient is in no acute distress.  Skin: Skin is warm and dry. No rash noted.   Cardiovascular: Normal heart rate noted  Respiratory: Normal respiratory effort, no problems with respiration noted  Abdomen: Soft, gravid, appropriate for gestational age. Pain/Pressure: Present     Pelvic: Vag. Bleeding: None     Cervical exam performed Dilation: 1 Effacement (%): Thick Station: -3  Extremities: Normal range of motion.  Edema: None  Mental Status: Normal mood and affect. Normal behavior. Normal judgment and thought content.   Urinalysis: Urine Protein: Negative Urine Glucose: Negative NST performed today was reviewed and was found to be reactive.  AFI was also normal.  Continue recommended antenatal testing and prenatal care.  Assessment and Plan:  Pregnancy: [id number] at 6957w5d  1. Encounter for supervision of high risk multigravida of advanced maternal age,  antepartum 2. Supervision of high risk elderly multigravida in second trimester Continue antenatal testing as recommended. Delivery at 40 weeks.  - Amniotic fluid index with NST - Culture, beta strep (group b only) - GC/Chlamydia probe amp ([place] Term labor symptoms and general obstetric precautions including but not limited to vaginal bleeding, contractions, leaking of fluid and fetal movement were reviewed in detail with the patient. Please refer to After Visit Summary for other counseling recommendations.  Return in about 5 days (around 01/21/2016) for as scheduled.   Tereso NewcomerUgonna A Jadian Karman, MD

## 2016-01-16 NOTE — Patient Instructions (Signed)
Return to clinic for any obstetric concerns or go to [facility] for evaluation  

## 2016-01-17 LAB — GC/CHLAMYDIA PROBE AMP (~~LOC~~) NOT AT ARMC
Chlamydia: NEGATIVE
Neisseria Gonorrhea: NEGATIVE

## 2016-01-18 LAB — CULTURE, BETA STREP (GROUP B ONLY)

## 2016-01-21 ENCOUNTER — Ambulatory Visit (INDEPENDENT_AMBULATORY_CARE_PROVIDER_SITE_OTHER): Payer: Medicaid Other | Admitting: *Deleted

## 2016-01-21 VITALS — BP 107/68 | HR 83

## 2016-01-21 DIAGNOSIS — O09523 Supervision of elderly multigravida, third trimester: Secondary | ICD-10-CM

## 2016-01-21 DIAGNOSIS — O09529 Supervision of elderly multigravida, unspecified trimester: Secondary | ICD-10-CM

## 2016-01-21 NOTE — Progress Notes (Signed)
NST performed today was reviewed and was found to be reactive.  Continue recommended antenatal testing and prenatal care.  

## 2016-01-23 ENCOUNTER — Encounter (HOSPITAL_COMMUNITY): Payer: Self-pay

## 2016-01-23 ENCOUNTER — Ambulatory Visit (INDEPENDENT_AMBULATORY_CARE_PROVIDER_SITE_OTHER): Payer: Medicaid Other | Admitting: Family

## 2016-01-23 ENCOUNTER — Inpatient Hospital Stay (HOSPITAL_COMMUNITY)
Admission: AD | Admit: 2016-01-23 | Discharge: 2016-01-25 | DRG: 775 | Disposition: A | Discharge status: Home / Self Care | Payer: Medicaid Other | Source: Ambulatory Visit | Attending: Obstetrics and Gynecology | Admitting: Obstetrics and Gynecology

## 2016-01-23 VITALS — BP 114/75 | HR 92 | Wt 154.2 lb

## 2016-01-23 DIAGNOSIS — O09523 Supervision of elderly multigravida, third trimester: Secondary | ICD-10-CM

## 2016-01-23 DIAGNOSIS — O09522 Supervision of elderly multigravida, second trimester: Secondary | ICD-10-CM

## 2016-01-23 DIAGNOSIS — IMO0001 Reserved for inherently not codable concepts without codable children: Secondary | ICD-10-CM

## 2016-01-23 DIAGNOSIS — O09529 Supervision of elderly multigravida, unspecified trimester: Secondary | ICD-10-CM

## 2016-01-23 DIAGNOSIS — Z3A38 38 weeks gestation of pregnancy: Secondary | ICD-10-CM

## 2016-01-23 LAB — CBC
HCT: 36 % (ref 36.0–46.0)
HEMOGLOBIN: 12.1 g/dL (ref 12.0–15.0)
MCH: 28.5 pg (ref 26.0–34.0)
MCHC: 33.6 g/dL (ref 30.0–36.0)
MCV: 84.7 fL (ref 78.0–100.0)
Platelets: 272 10*3/uL (ref 150–400)
RBC: 4.25 MIL/uL (ref 3.87–5.11)
RDW: 14.7 % (ref 11.5–15.5)
WBC: 7.7 10*3/uL (ref 4.0–10.5)

## 2016-01-23 LAB — FETAL NONSTRESS TEST

## 2016-01-23 LAB — POCT URINALYSIS DIP (DEVICE)
BILIRUBIN URINE: NEGATIVE
GLUCOSE, UA: NEGATIVE mg/dL
KETONES UR: NEGATIVE mg/dL
LEUKOCYTES UA: NEGATIVE
NITRITE: NEGATIVE
PH: 6 (ref 5.0–8.0)
Protein, ur: NEGATIVE mg/dL
Specific Gravity, Urine: <=1.005 (ref 1.005–1.030)
Urobilinogen, UA: 0.2 mg/dL (ref 0.0–1.0)

## 2016-01-23 LAB — TYPE AND SCREEN
ABO/RH(D): O POS
Antibody Screen: NEGATIVE

## 2016-01-23 MED ORDER — OXYCODONE-ACETAMINOPHEN 5-325 MG PO TABS: 2.0000 | ORAL_TABLET | ORAL | Status: DC | PRN

## 2016-01-23 MED ORDER — OXYCODONE-ACETAMINOPHEN 5-325 MG PO TABS: 1.0000 | ORAL_TABLET | ORAL | Status: DC | PRN

## 2016-01-23 MED ORDER — LACTATED RINGERS IV SOLN
INTRAVENOUS | Status: DC
  Administered 2016-01-23: 22:00:00 via INTRAVENOUS

## 2016-01-23 MED ORDER — ACETAMINOPHEN 325 MG PO TABS: 650.0000 mg | ORAL_TABLET | ORAL | Status: DC | PRN

## 2016-01-23 MED ORDER — LIDOCAINE HCL (PF) 1 % IJ SOLN
30.0000 mL | INTRAMUSCULAR | Status: DC | PRN
  Administered 2016-01-23: 30 mL via SUBCUTANEOUS
  Filled 2016-01-23: qty 30

## 2016-01-23 MED ORDER — CITRIC ACID-SODIUM CITRATE 334-500 MG/5ML PO SOLN: 30.0000 mL | ORAL | Status: DC | PRN

## 2016-01-23 MED ORDER — LACTATED RINGERS IV SOLN: 500.0000 mL | INTRAVENOUS | Status: DC | PRN

## 2016-01-23 MED ORDER — ONDANSETRON HCL 4 MG/2ML IJ SOLN: 4.0000 mg | Freq: Four times a day (QID) | INTRAMUSCULAR | Status: DC | PRN

## 2016-01-23 MED ORDER — FLEET ENEMA 7-19 GM/118ML RE ENEM: 1.0000 | ENEMA | RECTAL | Status: DC | PRN

## 2016-01-23 MED ORDER — IBUPROFEN 600 MG PO TABS
600.0000 mg | ORAL_TABLET | Freq: Four times a day (QID) | ORAL | Status: DC
  Administered 2016-01-23 – 2016-01-25 (×8): 600 mg via ORAL
  Filled 2016-01-23 (×7): qty 1

## 2016-01-23 MED ORDER — OXYTOCIN 10 UNIT/ML IJ SOLN
2.5000 [IU]/h | INTRAVENOUS | Status: DC
  Filled 2016-01-23: qty 10

## 2016-01-23 MED ORDER — OXYTOCIN BOLUS FROM INFUSION
500.0000 mL | INTRAVENOUS | Status: DC
Start: 2016-01-23 — End: 2016-01-24
  Administered 2016-01-23: 500 mL via INTRAVENOUS

## 2016-01-23 NOTE — H&P (Signed)
OBSTETRIC ADMISSION HISTORY AND PHYSICAL  Erin Buckley is a 45 y.o. female [phone] with IUP at 5219w5d by LMP c/w U/S presenting for SOL. She reports +FMs, No LOF, no VB, no blurry vision, headaches or peripheral edema, and RUQ pain.  She plans on breast feeding. She request Nexplanon for birth control.  Dating: By LMP c/w U/S --->  Estimated Date of Delivery: 02/01/16  Sono:  @[redacted]w[redacted]d , CWD, normal anatomy, cephalic presentation, 2395g, 45%55% EFW   Prenatal History/Complications: Seen at high risk clinic for advanced maternal age  Past Medical History: Past Medical History  Diagnosis Date  . No pertinent past medical history   . Medical history non-contributory     Past Surgical History: Past Surgical History  Procedure Laterality Date  . No past surgeries      Obstetrical History: OB History    Gravida Para Term Preterm AB TAB SAB Ectopic Multiple Living   4 2 2  0 1 0 1 0 0 2      Social History: Social History   Social History  . Marital Status: Married    Spouse Name: N/A  . Number of Children: N/A  . Years of Education: N/A   Social History Main Topics  . Smoking status: Never Smoker   . Smokeless tobacco: Never Used  . Alcohol Use: No  . Drug Use: No  . Sexual Activity: Yes   Other Topics Concern  . None   Social History Narrative    Family History: History reviewed. No pertinent family history.  Allergies: No Known Allergies  Prescriptions prior to admission  Medication Sig Dispense Refill Last Dose  . Prenatal Multivit-Min-Fe-FA (PRENATAL VITAMINS) 0.8 MG tablet Take 1 tablet by mouth daily. (Patient not taking: Reported on 01/23/2016) 30 tablet 12 Not Taking     Review of Systems   All systems reviewed and negative except as stated in HPI  Blood pressure 162/69, pulse 98, resp. rate 18, last menstrual period 04/27/2015, unknown if currently breastfeeding. General appearance: alert, cooperative and no distress Lungs: clear to auscultation  bilaterally Heart: regular rate and rhythm Abdomen: soft, non-tender; bowel sounds normal Extremities: Homans sign is negative, no sign of DVT Presentation: cephalic Fetal monitoringBaseline: 140 bpm, Variability: Good {> 6 bpm), Accelerations: Reactive and Decelerations: Absent Uterine activityFrequency: Every 1 minutes Dilation: 10 Effacement (%): 90 Station: -3 Exam by:: Jonathon JordanGambino, MD    Prenatal labs: ABO, Rh: O/POS/-- (11/15 1426) Antibody: NEG (11/15 1426) Rubella: !Error! RPR: NON REAC (01/04 1138)  HBsAg: NEGATIVE (11/15 1426)  HIV: NONREACTIVE (01/04 1138)  GBS: Negative (03/09 0000)  1 hr Glucola 130 Genetic screening  Declined at initial visit Anatomy US WNL  Prenatal Transfer Tool  Maternal Diabetes: No Genetic Screening: Declined Maternal Ultrasounds/Referrals: Normal Fetal Ultrasounds or other Referrals:  None Maternal Substance Abuse:  No Significant Maternal Medications:  None Significant Maternal Lab Results: None  Results for orders placed or performed during the hospital encounter of 01/23/16 (from the past 24 hour(s))  CBC   Collection Time: 01/23/16  9:20 PM  Result Value Ref Range   WBC 7.7 4.0 - 10.5 K/uL   RBC 4.25 3.87 - 5.11 MIL/uL   Hemoglobin 12.1 12.0 - 15.0 g/dL   HCT 40.936.0 81.136.0 - 91.446.0 %   MCV 84.7 78.0 - 100.0 fL   MCH 28.5 26.0 - 34.0 pg   MCHC 33.6 30.0 - 36.0 g/dL   RDW 78.214.7 95.611.5 - 21.315.5 %   Platelets 272 150 - 400 K/uL  Results  for orders placed or performed in visit on 01/23/16 (from the past 24 hour(s))  POCT urinalysis dip (device)   Collection Time: 01/23/16  8:58 AM  Result Value Ref Range   Glucose, UA NEGATIVE NEGATIVE mg/dL   Bilirubin Urine NEGATIVE NEGATIVE   Ketones, ur NEGATIVE NEGATIVE mg/dL   Specific Gravity, Urine <=1.005 1.005 - 1.030   Hgb urine dipstick LARGE (A) NEGATIVE   pH 6.0 5.0 - 8.0   Protein, ur NEGATIVE NEGATIVE mg/dL   Urobilinogen, UA 0.2 0.0 - 1.0 mg/dL   Nitrite NEGATIVE NEGATIVE   Leukocytes,  UA NEGATIVE NEGATIVE    Patient Active Problem List   Diagnosis Date Noted  . Active labor 01/23/2016  . Language barrier 12/25/2015  . Encounter for supervision of high risk multigravida of advanced maternal age, antepartum 11/27/2015  . Supervision of high risk elderly multigravida in second trimester 10/23/2015    Assessment: Erin Buckley is a 45 y.o. [id number] at [redacted]w[redacted]d here for SOL  #Labor: In active labor, anticipate delivery #Pain: No pain medication requested per patient #FWB: Cat 1 #ID:  negative #MOF: breast #MOC:Nexplanon #Circ:  declined  Beaulah Dinning 01/23/2016, 10:13 PM   I have seen and examined this patient and agree the above assessment.    CRESENZO-DISHMAN,Zabian Swayne 01/28/2016 8:49 AM

## 2016-01-23 NOTE — MAU Note (Signed)
Notified provider that patient is 7-8. Provider said she would put in admit orders.

## 2016-01-23 NOTE — Addendum Note (Signed)
Addended by: Jill SideAY, DIANE L on: 01/23/2016 03:24 PM   Modules accepted: Orders

## 2016-01-23 NOTE — Progress Notes (Signed)
Interpreter Erin Buckley present for encounter.  Pt reports abdominal pain and small amount of vaginal bleeding this morning. Large Hgb noted on udip today. IOL scheduled - will be discussed w/pt by provider.

## 2016-01-23 NOTE — MAU Note (Signed)
Pt presents complaining of contractions. Denies leaking or bleeding.  

## 2016-01-23 NOTE — Progress Notes (Signed)
Subjective:  Erin Buckley is a 45 y.o. [id number] at 3979w5d being seen today for ongoing prenatal care.  She is currently monitored for the following issues for this high-risk pregnancy and has Supervision of high risk elderly multigravida in second trimester; Encounter for supervision of high risk multigravida of advanced maternal age, antepartum; and Language barrier on her problem list.  Patient reports occasional contractions.  Contractions: Irregular. Vag. Bleeding: Small.  Movement: Present. Denies leaking of fluid.   The following portions of the patient's history were reviewed and updated as appropriate: allergies, current medications, past family history, past medical history, past social history, past surgical history and problem list. Problem list updated.  Objective:   Filed Vitals:   01/23/16 0908  BP: 114/75  Pulse: 92  Weight: 154 lb 3.2 oz (69.945 kg)    Fetal Status: Fetal Heart Rate (bpm): NST-R Fundal Height: 38 cm Movement: Present  Presentation: Vertex  General:  Alert, oriented and cooperative. Patient is in no acute distress.  Skin: Skin is warm and dry. No rash noted.   Cardiovascular: Normal heart rate noted  Respiratory: Normal respiratory effort, no problems with respiration noted  Abdomen: Soft, gravid, appropriate for gestational age. Pain/Pressure: Present     Pelvic: Vag. Bleeding: Small     Cervical exam deferred Dilation: 2.5 Effacement (%): 50 Station: -3  Extremities: Normal range of motion.  Edema: None  Mental Status: Normal mood and affect. Normal behavior. Normal judgment and thought content.   Urinalysis: Urine Protein: Negative Urine Glucose: Negative  Assessment and Plan:  Pregnancy: [id number] at 2979w5d  1. Encounter for supervision of high risk multigravida of advanced maternal age, antepartum - Fetal nonstress test > reactive  2. Advanced maternal age in multigravida, third trimester - US MFM OB FOLLOW UP; Future > growth  3. Supervision of high  risk elderly multigravida in second trimester - IOL at 40 wks, scheduled 02/01/16  Term labor symptoms and general obstetric precautions including but not limited to vaginal bleeding, contractions, leaking of fluid and fetal movement were reviewed in detail with the patient. Please refer to After Visit Summary for other counseling recommendations.  Return in about 4 days (around 01/27/2016) for as scheduled.   Eino FarberWalidah Kennith GainN Karim, CNM

## 2016-01-24 MED ORDER — OXYCODONE-ACETAMINOPHEN 5-325 MG PO TABS: 1.0000 | ORAL_TABLET | ORAL | Status: DC | PRN

## 2016-01-24 MED ORDER — PRENATAL MULTIVITAMIN CH
1.0000 | ORAL_TABLET | Freq: Every day | ORAL | Status: DC
  Administered 2016-01-24 – 2016-01-25 (×2): 1 via ORAL
  Filled 2016-01-24 (×2): qty 1

## 2016-01-24 MED ORDER — METHYLERGONOVINE MALEATE 0.2 MG PO TABS: 0.2000 mg | ORAL_TABLET | ORAL | Status: DC | PRN

## 2016-01-24 MED ORDER — ONDANSETRON HCL 4 MG PO TABS: 4.0000 mg | ORAL_TABLET | ORAL | Status: DC | PRN

## 2016-01-24 MED ORDER — OXYCODONE-ACETAMINOPHEN 5-325 MG PO TABS: 2.0000 | ORAL_TABLET | ORAL | Status: DC | PRN

## 2016-01-24 MED ORDER — FLEET ENEMA 7-19 GM/118ML RE ENEM: 1.0000 | ENEMA | Freq: Every day | RECTAL | Status: DC | PRN

## 2016-01-24 MED ORDER — DOCUSATE SODIUM 100 MG PO CAPS
100.0000 mg | ORAL_CAPSULE | Freq: Two times a day (BID) | ORAL | Status: DC
  Administered 2016-01-24 – 2016-01-25 (×4): 100 mg via ORAL
  Filled 2016-01-24 (×4): qty 1

## 2016-01-24 MED ORDER — DIBUCAINE 1 % RE OINT: 1.0000 "application " | TOPICAL_OINTMENT | RECTAL | Status: DC | PRN

## 2016-01-24 MED ORDER — OXYTOCIN 10 UNIT/ML IJ SOLN: 2.5000 [IU]/h | INTRAVENOUS | Status: DC | PRN

## 2016-01-24 MED ORDER — BISACODYL 10 MG RE SUPP: 10.0000 mg | Freq: Every day | RECTAL | Status: DC | PRN

## 2016-01-24 MED ORDER — WITCH HAZEL-GLYCERIN EX PADS: 1.0000 "application " | MEDICATED_PAD | CUTANEOUS | Status: DC | PRN

## 2016-01-24 MED ORDER — BENZOCAINE-MENTHOL 20-0.5 % EX AERO
1.0000 "application " | INHALATION_SPRAY | CUTANEOUS | Status: DC | PRN
  Administered 2016-01-24: 1 via TOPICAL
  Filled 2016-01-24: qty 56

## 2016-01-24 MED ORDER — ZOLPIDEM TARTRATE 5 MG PO TABS: 5.0000 mg | ORAL_TABLET | Freq: Every evening | ORAL | Status: DC | PRN

## 2016-01-24 MED ORDER — LANOLIN HYDROUS EX OINT: TOPICAL_OINTMENT | CUTANEOUS | Status: DC | PRN

## 2016-01-24 MED ORDER — SIMETHICONE 80 MG PO CHEW: 80.0000 mg | CHEWABLE_TABLET | ORAL | Status: DC | PRN

## 2016-01-24 MED ORDER — MEASLES, MUMPS & RUBELLA VAC ~~LOC~~ INJ
0.5000 mL | INJECTION | Freq: Once | SUBCUTANEOUS | Status: DC
  Filled 2016-01-24: qty 0.5

## 2016-01-24 MED ORDER — DIPHENHYDRAMINE HCL 25 MG PO CAPS: 25.0000 mg | ORAL_CAPSULE | Freq: Four times a day (QID) | ORAL | Status: DC | PRN

## 2016-01-24 MED ORDER — TETANUS-DIPHTH-ACELL PERTUSSIS 5-2.5-18.5 LF-MCG/0.5 IM SUSP: 0.5000 mL | Freq: Once | INTRAMUSCULAR | Status: DC

## 2016-01-24 MED ORDER — ACETAMINOPHEN 325 MG PO TABS: 650.0000 mg | ORAL_TABLET | ORAL | Status: DC | PRN

## 2016-01-24 MED ORDER — ONDANSETRON HCL 4 MG/2ML IJ SOLN: 4.0000 mg | INTRAMUSCULAR | Status: DC | PRN

## 2016-01-24 MED ORDER — METHYLERGONOVINE MALEATE 0.2 MG/ML IJ SOLN: 0.2000 mg | INTRAMUSCULAR | Status: DC | PRN

## 2016-01-24 NOTE — Progress Notes (Signed)
Patient ID: Erin Buckley, female DOB: 1971/04/10, 45 y.o. MRN: [id number] Postpartum Day 1  Subjective: Erin Buckley is a 45 y.o. [id number] PPD#1 s/p SVD at 6937w5d. She speaks [place] and a telephone interpreter was used for the encounter. No acute events overnight. Pt denies problems with ambulating, voiding or po intake. She has not had bowel movement.Pain is well controlled with ibuprofen. Lochia is appropriate. She plans on breastfeeding. Plan for birth control is Nexplanon. She had a boy and declined circumcision.  Objective: BP 109/70 mmHg  Pulse 95  Temp(Src) 98.5 F (36.9 C) (Oral)  Resp 18  Wt 68.04 kg (150 lb)  Breastfeeding? Yes  Physical Exam:  General: alert, cooperative and no distress Lochia: appropriate Chest: CTAB Heart: RRR Abdomen: soft, nontender, fundus firm DVT Evaluation: no calf tenderness, Homan's sign negative Extremities: no edema  Assessment / Plan: Erin Buckley is a 45 y.o. [id number] PPD#1 s/p SVD at 6837w5d, doing well.  Plan to discharge home tomorrow.  Felton ClintonKali Valina Maes, Med Student 01/22/2016, 8:53 AM

## 2016-01-24 NOTE — Lactation Note (Signed)
This note was copied from a baby's chart. Lactation Consultation Note initial visit at 15 hours of age.  Mom speaks Jarai/vietnamese.  "[employer]" interpreter on wheels not working, [employer] did not have [place] interpreter available, [phone] spoke vietnamese.  Mom is an experienced breast feeder and denies concerns.  Mom reports baby is eating, but she is unsure if she has enough milk.  Mom is able to easily express several drops of colostrum and encouraged to breastfeed on demand to establish a good milk supply.  Encouraged mom to wait until Dr. Aundria Rudells her to use formula if needed later.  Baby is asleep and mom declines latch assist at this time.  Mom denies pain with previous latches.  LC encouraged mom to feed with early cues and document feedings and diaper changes.  [facility] resources given and discussed.  Mom to call for assist as needed.  During visit birth registrar came to speak to mom, OB and then pharmacy as LC was finished.     Patient Name: Erin Buckley Today's Date: 01/24/2016 Reason for consult: Initial assessment   Maternal Data Has patient been taught Hand Expression?: Yes Does the patient have breastfeeding experience prior to this delivery?: Yes  Feeding Feeding Type: Breast Fed Length of feed: 5 min  LATCH Score/Interventions                Intervention(s): Breastfeeding basics reviewed     Lactation Tools Discussed/Used     Consult Status Consult Status: Follow-up Date: 01/25/16 Follow-up type: In-patient    Jannifer RodneyShoptaw, Jana Lynn 01/24/2016, 1:03 PM

## 2016-01-24 NOTE — Progress Notes (Signed)
POSTPARTUM PROGRESS NOTE  Post Partum Day 1 Subjective:  Erin Buckley is a 45 y.o. [id number] 4939w5d s/p nsvd.  No acute events overnight.  Pt denies problems with ambulating, voiding or po intake.  She denies nausea or vomiting.  Pain is well controlled.  She has had flatus. She has not had bowel movement.  Lochia Small.   Objective: Blood pressure 109/70, pulse 95, temperature 98.5 F (36.9 C), temperature source Oral, resp. rate 18, height 5\' 3"  (1.6 m), weight 150 lb (68.04 kg), last menstrual period 04/27/2015, unknown if currently breastfeeding.  Physical Exam:  General: alert, cooperative and no distress Lochia:normal flow Chest: CTAB Heart: RRR no m/r/g Abdomen: +BS, soft, nontender,  Uterine Fundus: firm,  DVT Evaluation: No calf swelling or tenderness Extremities: trace edema   Recent Labs  01/23/16 2120  HGB 12.1  HCT 36.0    Assessment/Plan:  ASSESSMENT: Erin Buckley is a 45 y.o. O1H0865G4P3012 9539w5d s/p nsvd, doing well  Plan for discharge tomorrow   LOS: 1 day   Silvano Bilisoah B Verdene Creson 01/24/2016, 4:12 PM

## 2016-01-25 LAB — RPR: RPR Ser Ql: NONREACTIVE

## 2016-01-25 MED ORDER — IBUPROFEN 600 MG PO TABS: 600.0000 mg | ORAL_TABLET | Freq: Four times a day (QID) | ORAL | Status: DC

## 2016-01-25 NOTE — Lactation Note (Signed)
This note was copied from a baby's chart. Lactation Consultation Note  Patient Name: Boy Darnetta Fuhs Today's Date: 01/25/2016 Reason for consult: Follow-up assessment;Hyperbilirubinemia Visited with Mom on day of discharge, baby 3636 hrs old.  Family member in room to assist with translating.  Baby under double phototherapy.  Encouraged Mom to continue to breast feed often, more like every 2 hrs if she can during the day.  Watched as she latched baby onto breast, pillow support given.  Baby latched deeply, and had consistent suck/swallow pattern.  Manual expression done and transitional milk easily expressed.  Mom denies any difficulty or pain with latching.   Mom has fed baby 11 times in last 24 hrs.  Adequate output noted (5 voids, 1 stool).  Demonstrated how to use breast compression during feedings to increase milk transfer.  To follow up prn and in am.  Consult Status Consult Status: Follow-up Date: 01/26/16 Follow-up type: Call as needed    Judee ClaraSmith, Maxamilian Amadon E 01/25/2016, 10:42 AM

## 2016-01-25 NOTE — Discharge Instructions (Signed)

## 2016-01-25 NOTE — Discharge Summary (Signed)
OB Discharge Summary     Patient Name: Erin Buckley DOB: August 03, 1971 MRN: [id number]  Date of admission: 01/23/2016 Delivering MD: Anders Simmonds M   Date of discharge: 01/25/2016  Admitting diagnosis: 38w ctx labor Intrauterine pregnancy: [redacted]w[redacted]d     Secondary diagnosis:  Active Problems:   Active labor  Additional problems:  Patient Active Problem List   Diagnosis Date Noted  . Active labor 01/23/2016  . Language barrier 12/25/2015  . Encounter for supervision of high risk multigravida of advanced maternal age, antepartum 11/27/2015  . Supervision of high risk elderly multigravida in second trimester 10/23/2015       Discharge diagnosis: Term Pregnancy Delivered                                                                                                Post partum procedures:none  Augmentation: none  Complications: None  Hospital course:  Onset of Labor With Vaginal Delivery     45 y.o. yo [id number] at [redacted]w[redacted]d was admitted in Active Labor on 01/23/2016. Patient had an uncomplicated labor course as follows:  Membrane Rupture Time/Date: 9:31 PM ,01/23/2016   Intrapartum Procedures: Episiotomy: None [1]                                         Lacerations:  2nd degree [3];Vaginal [6]  Patient had a delivery of a Viable infant. 01/23/2016  Information for the patient's newborn:  Sissy, Goetzke Boy Kahliya [[id number]]  Delivery Method: Vaginal, Spontaneous Delivery (Filed from Delivery Summary)    Pateint had an uncomplicated postpartum course.  She is ambulating, tolerating a regular diet, passing flatus, and urinating well. Patient is discharged home in stable condition on 01/25/2016.    Physical exam  Filed Vitals:   01/24/16 0100 01/24/16 0448 01/24/16 1807 01/25/16 0526  BP: 146/87 109/70 111/66 102/67  Pulse: 92 95 74 78  Temp: 98.4 F (36.9 C) 98.5 F (36.9 C) 98.4 F (36.9 C) 98.2 F (36.8 C)  TempSrc: Oral Oral Oral Oral  Resp: Height:      Weight:        General: alert, cooperative and no distress Lochia: appropriate Uterine Fundus: firm Incision: N/A DVT Evaluation: No evidence of DVT seen on physical exam. Negative Homan's sign. No significant calf/ankle edema. Labs: Lab Results  Component Value Date   WBC 7.7 01/23/2016   HGB 12.1 01/23/2016   HCT 36.0 01/23/2016   MCV 84.7 01/23/2016   PLT 272 01/23/2016   CMP Latest Ref Rng 01/27/2011  Glucose 70 - 99 mg/dL 92  BUN 6 - 23 mg/dL 8  Creatinine 0.4 - 1.2 mg/dL 1.30  Sodium 865 - 784 mEq/L 132(L)  Potassium 3.5 - 5.1 mEq/L 3.9  Chloride 96 - 112 mEq/L 101  CO2 19 - 32 mEq/L 20  Calcium 8.4 - 10.5 mg/dL 8.8  Total Protein 6.0 - 8.3 g/dL 6.5  Total Bilirubin 0.3 - 1.2 mg/dL 1.0  Alkaline Phos 39 - 117 U/L  256(H)  AST 0 - 37 U/L 30  ALT 0 - 35 U/L 27    Discharge instruction: per After Visit Summary and "Baby and Me Booklet".  After visit meds:    Medication List    Notice    You have not been prescribed any medications.      Diet: routine diet  Activity: Advance as tolerated. Pelvic rest for 6 weeks.   Outpatient follow up:6 weeks Follow up Appt:Future Appointments Date Time Provider Department Center  02/28/2016 9:30 AM Levie HeritageJacob J Stinson, DO [facility]   Follow up Visit:No Follow-up on file.  Postpartum contraception: Nexplanon  Newborn Data: Live born female  Birth Weight: 7 lb 6.7 oz (3365 g) APGAR: 9, 9  Baby Feeding: Breast Disposition:home with mother   01/25/2016 Beaulah Dinninghristina M Gambino, MD   OB fellow attestation I have seen and examined this patient and agree with above documentation in the resident's note.   Erin Buckley is a 45 y.o. [id number] s/p NSVD.   Pain is well controlled.  Plan for birth control is Nexplanon.  Method of Feeding: Breast  PE:  BP 102/67 mmHg  Pulse 78  Temp(Src) 98.2 F (36.8 C) (Oral)  Resp 18  Ht 5\' 3"  (1.6 m)  Wt 150 lb (68.04 kg)  BMI 26.58 kg/m2  LMP 04/27/2015  Breastfeeding? Unknown Gen: well  appearing Heart: reg rate Lungs: normal WOB Fundus firm Ext: soft, no pain, no edema  No results for input(s): HGB, HCT in the last 72 hours.  Plan: discharge today - postpartum care discussed - f/u clinic in 6 weeks for postpartum visit   Federico FlakeKimberly Niles Moselle Rister, MD 1:56 PM

## 2016-01-26 ENCOUNTER — Ambulatory Visit: Payer: Self-pay

## 2016-01-26 NOTE — Lactation Note (Signed)
This note was copied from a baby's chart. Lactation Consultation Note; Infant is 3659 hours old and is still under double photo therapy . [place] interpreter in room that is a family member at bedside for all teaching at bedside.  Mother is breastfeeding every 2-3 hours. Mother denies having any pain on her nipples when breastfeeding. She is an experienced breastfeeding mother .  She breastfed her other 2 children for 2 years. Mother has understanding of treatment to prevent engorgement. Mother is aware of available LC services and community support.   Patient Name: Erin Buckley Today's Date: 01/26/2016 Reason for consult: Follow-up assessment   Maternal Data    Feeding Feeding Type: Breast Fed Length of feed: 20 min  LATCH Score/Interventions                      Lactation Tools Discussed/Used     Consult Status Consult Status: Complete    Michel BickersKendrick, Zyanya Glaza McCoy 01/26/2016, 11:06 AM

## 2016-01-27 ENCOUNTER — Ambulatory Visit: Payer: Self-pay

## 2016-01-27 ENCOUNTER — Ambulatory Visit (HOSPITAL_COMMUNITY): Payer: Medicaid Other

## 2016-01-27 NOTE — Lactation Note (Signed)
This note was copied from a baby's chart. Lactation Consultation Note  Patient Name: Erin Buckley Today's Date: 01/27/2016 Reason for consult: Follow-up assessment   Follow up with mom of 6186 hour old infant.Spoke with mom through [employer] [place] Interpreter Jacqueline # [phone], Estanislado Sp[place]arai interpreter was not available. Infant with 12 BF for 10-20 minutes, 1 bottle EBM of 20 cc, 6 voids and 5 stools in also 24 hours. LATCH Scores 8-10 by bedside RN. Infant weight 7 lb 7.1 oz with a weight increase of 0.4 oz in last 24 hours.   Per mom she feels BF is going well and does not have any questions. Infant still on single phototherapy and not to be d/c today.  Mom latched infant to left breast independently, infant with flanged lips, rhythmic sucking and frequent swallows.   Enc mom to call with questions/concerns.     Maternal Data Formula Feeding for Exclusion: No  Feeding Feeding Type: Breast Fed Nipple Type: Slow - flow Length of feed: 15 min  LATCH Score/Interventions Latch: Grasps breast easily, tongue down, lips flanged, rhythmical sucking.  Audible Swallowing: Spontaneous and intermittent  Type of Nipple: Everted at rest and after stimulation  Comfort (Breast/Nipple): Soft / non-tender     Hold (Positioning): No assistance needed to correctly position infant at breast. Intervention(s): Breastfeeding basics reviewed;Support Pillows;Position options;Skin to skin  LATCH Score: 10  Lactation Tools Discussed/Used     Consult Status Consult Status: Follow-up Date: 01/28/16 Follow-up type: In-patient    Silas FloodSharon S Loveda Colaizzi 01/27/2016, 12:36 PM

## 2016-01-28 ENCOUNTER — Ambulatory Visit: Payer: Self-pay

## 2016-01-28 ENCOUNTER — Other Ambulatory Visit: Payer: Medicaid Other

## 2016-01-28 NOTE — Lactation Note (Signed)
This note was copied from a baby's chart. Lactation Consultation Note  Interpreter in room [facility] with [facility]. Baby almost back to birth weight. Peds MD's came in during consult to speak with mother. Baby fussy but mother eventually baby latched in cradle position. Mother had recently pumped some breastmilk but dumped it not understanding storage guidelines. Reviewed milk storage guidelines. Observed sucks and swallows and demonstrated how to breastfeed with phototherapy light on baby's back.   Patient Name: Erin Buckley Today's Date: 01/28/2016 Reason for consult: Follow-up assessment   Maternal Data    Feeding Feeding Type: Breast Fed Length of feed: 15 min  LATCH Score/Interventions Latch: Grasps breast easily, tongue down, lips flanged, rhythmical sucking. Intervention(s): Adjust position;Assist with latch;Breast compression  Audible Swallowing: A few with stimulation Intervention(s): Skin to skin;Hand expression  Type of Nipple: Everted at rest and after stimulation Intervention(s): No intervention needed  Comfort (Breast/Nipple): Soft / non-tender     Hold (Positioning): No assistance needed to correctly position infant at breast. Intervention(s): Breastfeeding basics reviewed;Support Pillows;Position options;Skin to skin  LATCH Score: 9  Lactation Tools Discussed/Used     Consult Status Consult Status: Complete    Hardie PulleyBerkelhammer, Ruth Boschen 01/28/2016, 10:33 AM

## 2016-01-30 ENCOUNTER — Other Ambulatory Visit: Payer: Medicaid Other

## 2016-02-03 ENCOUNTER — Ambulatory Visit (HOSPITAL_COMMUNITY): Payer: Medicaid Other

## 2016-02-28 ENCOUNTER — Ambulatory Visit: Payer: Medicaid Other | Admitting: Family Medicine

## 2016-03-06 ENCOUNTER — Ambulatory Visit (INDEPENDENT_AMBULATORY_CARE_PROVIDER_SITE_OTHER): Payer: Medicaid Other | Admitting: Family Medicine

## 2016-03-06 ENCOUNTER — Encounter: Payer: Self-pay | Admitting: Family Medicine

## 2016-03-06 DIAGNOSIS — Z3202 Encounter for pregnancy test, result negative: Secondary | ICD-10-CM

## 2016-03-06 DIAGNOSIS — Z975 Presence of (intrauterine) contraceptive device: Secondary | ICD-10-CM

## 2016-03-06 DIAGNOSIS — Z30017 Encounter for initial prescription of implantable subdermal contraceptive: Secondary | ICD-10-CM | POA: Diagnosis not present

## 2016-03-06 LAB — POCT PREGNANCY, URINE: Preg Test, Ur: NEGATIVE

## 2016-03-06 MED ORDER — ETONOGESTREL 68 MG ~~LOC~~ IMPL
68.0000 mg | DRUG_IMPLANT | Freq: Once | SUBCUTANEOUS | Status: AC
  Administered 2016-03-06: 68 mg via SUBCUTANEOUS

## 2016-03-06 NOTE — Addendum Note (Signed)
Addended by: Gerome ApleyZEYFANG, Vonya Ohalloran L on: 03/06/2016 10:29 AM   Modules accepted: Orders

## 2016-03-06 NOTE — Progress Notes (Signed)
Patient ID: [name], female   DOB: 1971/07/16, 45 y.o.   MRN: [id number] Subjective:     Erin Buckley is a 45 y.o. female who presents for a postpartum visit. She is 6 weeks postpartum following a spontaneous vaginal delivery. I have fully reviewed the prenatal and intrapartum course. The delivery was at 10279w5d gestational weeks. Outcome: spontaneous vaginal delivery. Anesthesia: none. Postpartum course has been normal. Baby's course has been normal. Baby is feeding by breast. Bleeding no bleeding. Bowel function is normal. Bladder function is normal. Patient is not sexually active. Contraception method is Nexplanon. Postpartum depression screening: negative.  The following portions of the patient's history were reviewed and updated as appropriate: allergies, current medications, past family history, past medical history, past social history, past surgical history and problem list.  Review of Systems Pertinent items are noted in HPI.   Objective:    BP 113/73 mmHg  Pulse 72  Temp(Src) 98.6 F (37 C)  Wt 133 lb 11.2 oz (60.646 kg)  Breastfeeding? Yes  General:  alert, cooperative and no distress     Lungs: clear to auscultation bilaterally  Heart:  regular rate and rhythm, S1, S2 normal, no murmur, click, rub or gallop  Abdomen: soft, non-tender; bowel sounds normal; no masses,  no organomegaly        Assessment:     Normal postpartum exam. Pap smear not done at today's visit.   Plan:    1. Contraception: Nexplanon - placed today 2. Follow up in: 1 year or as needed.     Nexplanon Insertion:  Patient given informed consent, signed copy in the chart, time out was performed. Pregnancy test was neg. Appropriate time out taken.  Patient's left arm was prepped and draped in the usual sterile fashion.. The ruler used to measure and mark insertion area.  Pt was prepped with alcohol swab and then injected with 5 cc of 1% lidocaine with epinephrine.  Pt was prepped with betadine, Implanon  removed form packaging,  Device confirmed in needle, then inserted full length of needle and withdrawn per handbook instructions.  Device palpated by physician and patient.  Pt insertion site covered with gauze and coban.   Minimal blood loss.  Pt tolerated the procedure well.

## 2016-03-06 NOTE — Progress Notes (Signed)
Used Interpreter Capital One[employer]ina Rmah.

## 2018-01-17 ENCOUNTER — Encounter: Payer: Self-pay | Admitting: *Deleted

## 2019-04-27 ENCOUNTER — Other Ambulatory Visit: Payer: Self-pay

## 2019-04-27 ENCOUNTER — Ambulatory Visit (INDEPENDENT_AMBULATORY_CARE_PROVIDER_SITE_OTHER): Payer: Medicaid Other | Admitting: Family Medicine

## 2019-04-27 DIAGNOSIS — Z3046 Encounter for surveillance of implantable subdermal contraceptive: Secondary | ICD-10-CM | POA: Diagnosis not present

## 2019-04-27 NOTE — Progress Notes (Signed)
  Nexplanon Removal:  Patient given informed consent for removal of her Implanon, time out was performed.  Signed copy in the chart.  Appropriate time out taken. Implanon site identified.  Area prepped in usual sterile fashon. One cc of 1% lidocaine was used to anesthetize the area at the distal end of the implant. A small stab incision was made right beside the implant on the distal portion.  The implanon rod was grasped using hemostats and removed without difficulty.  There was less than 3 cc blood loss. There were no complications.  A small amount of antibiotic ointment and steri-strips were applied over the small incision.  A pressure bandage was applied to reduce any bruising.  The patient tolerated the procedure well and was given post procedure instructions.  Patient not wanting birth control at this moment. Discussed options, including BTL. Patient to talk with husband. Will schedule for Annual exam.

## 2019-05-16 ENCOUNTER — Encounter: Payer: Self-pay | Admitting: *Deleted

## 2019-06-26 ENCOUNTER — Ambulatory Visit: Payer: Medicaid Other | Admitting: Family Medicine

## 2023-11-29 ENCOUNTER — Ambulatory Visit
Admission: EM | Admit: 2023-11-29 | Discharge: 2023-11-29 | Disposition: A | Discharge status: Home / Self Care | Payer: Managed Care, Other (non HMO) | Attending: Family Medicine | Admitting: Family Medicine

## 2023-11-29 DIAGNOSIS — R1012 Left upper quadrant pain: Secondary | ICD-10-CM

## 2023-11-29 LAB — POCT URINALYSIS DIP (MANUAL ENTRY)
Bilirubin, UA: NEGATIVE
Blood, UA: NEGATIVE
Glucose, UA: NEGATIVE mg/dL
Ketones, POC UA: NEGATIVE mg/dL
Leukocytes, UA: NEGATIVE
Nitrite, UA: NEGATIVE
Protein Ur, POC: 30 mg/dL — AB
Spec Grav, UA: >=1.03 — AB (ref 1.010–1.025)
Urobilinogen, UA: 0.2 U/dL
pH, UA: 6 (ref 5.0–8.0)

## 2023-11-29 LAB — POCT URINE PREGNANCY: Preg Test, Ur: NEGATIVE

## 2023-11-29 NOTE — Discharge Instructions (Addendum)
Follow-up with a primary care provider Emergency department for severe symptoms or concerns

## 2023-11-29 NOTE — ED Provider Notes (Signed)
[facility]    CSN: [id number] Arrival date & time: 11/29/23  1324      History   Chief Complaint No chief complaint on file.   HPI Erin [facility] is a 53 y.o. female.   The history is provided by the patient. The history is limited by a language barrier. A language interpreter was used.  Translator via phone Jamelle Haring [phone] Patient reports abdominal pain onset "a long time ago", she will not quantify the time.  Pain is mostly left upper quadrant described as sharp, radiates to the back, comes and goes, can last for days, at worst an 8 out of 10.  This episode started 3 days ago.  Pain is worse with activity and working.  Denies palliative factors.  Admits nausea and constipation.  Pain is not affected by meals.  Denies history of prior abdominal surgery.  Daily medications, no known drug allergies, denies smoking and alcohol use.  Medical provider for her symptoms. Denies fever, vomiting, diarrhea, bloody stool, dysuria, frequency, urgency, hematuria, chest pain, shortness of breath, change in weight, urinary symptoms.  Last menstrual period was in December.   Past Medical History:  Diagnosis Date   Medical history non-contributory    No pertinent past medical history     Patient Active Problem List   Diagnosis Date Noted   Nexplanon removal 04/27/2019   Active labor 01/23/2016   Language barrier 12/25/2015   Encounter for supervision of high risk multigravida of advanced maternal age, antepartum 11/27/2015   Supervision of high risk elderly multigravida in second trimester 10/23/2015    Past Surgical History:  Procedure Laterality Date   NO PAST SURGERIES      OB History     Gravida  4   Para  3   Term  3   Preterm  0   AB  1   Living  3      SAB  1   IAB  0   Ectopic  0   Multiple  0   Live Births  2            Home Medications    Prior to Admission medications   Medication Sig Start Date End Date Taking? Authorizing Provider   ibuprofen (ADVIL) 200 MG tablet Take 200 mg by mouth every 6 (six) hours as needed.    [provider]  Prenatal Vit-Fe Fumarate-FA (PNV PRENATAL PLUS MULTIVITAMIN) 27-1 MG TABS Take 1 tablet by mouth daily. 02/06/16   [provider]    Family History No family history on file.  Social History Social History   Tobacco Use   Smoking status: Never   Smokeless tobacco: Never  Substance Use Topics   Alcohol use: No   Drug use: No     Allergies   Patient has no known allergies.   Review of Systems Review of Systems  Constitutional:  Negative for appetite change, fatigue, fever and unexpected weight change.  Gastrointestinal:  Positive for abdominal pain, constipation and nausea. Negative for blood in stool and diarrhea.  Genitourinary:  Negative for difficulty urinating and dysuria.  Skin:  Negative for rash.     Physical Exam Triage Vital Signs ED Triage Vitals  Encounter Vitals Group     BP      Systolic BP Percentile      Diastolic BP Percentile      Pulse      Resp      Temp      Temp  src      SpO2      Weight      Height      Head Circumference      Peak Flow      Pain Score      Pain Loc      Pain Education      Exclude from Growth Chart    No data found.  Updated Vital Signs There were no vitals taken for this visit.  Visual Acuity Right Eye Distance:   Left Eye Distance:   Bilateral Distance:    Right Eye Near:   Left Eye Near:    Bilateral Near:     Physical Exam Vitals and nursing note reviewed.  Constitutional:      Appearance: She is not ill-appearing or toxic-appearing.  HENT:     Head: Normocephalic and atraumatic.  Eyes:     Extraocular Movements: Extraocular movements intact.  Cardiovascular:     Rate and Rhythm: Normal rate and regular rhythm.  Pulmonary:     Effort: Pulmonary effort is normal.     Breath sounds: Normal breath sounds. No wheezing or rhonchi.  Abdominal:     General: Abdomen is flat. Bowel  sounds are normal. There is no distension.     Palpations: Abdomen is soft. There is no mass.     Tenderness: There is no abdominal tenderness. There is no right CVA tenderness, left CVA tenderness or guarding.  Skin:    General: Skin is warm.  Neurological:     Mental Status: She is alert.  Psychiatric:        Mood and Affect: Mood normal.      UC Treatments / Results  Labs (all labs ordered are listed, but only abnormal results are displayed) Labs Reviewed - No data to display  EKG   Radiology No results found.  Procedures Procedures (including critical care time)  Medications Ordered in UC Medications - No data to display  Initial Impression / Assessment and Plan / UC Course  I have reviewed the triage vital signs and the nursing notes.  Pertinent labs & imaging results that were available during my care of the patient were reviewed by me and considered in my medical decision making (see chart for details).     53 year old female left upper quadrant abdominal pain for "a long time", she is well-appearing, vital signs are stable, no abdominal tenderness.  Will check basic labs.  Counseled patient she will likely need to follow-up with a primary care provider for further evaluation.  Recommend bland diet, ED for severe symptoms, can take OTC NSAIDs as needed pain Final Clinical Impressions(s) / UC Diagnoses   Final diagnoses:  None   Discharge Instructions   None    ED Prescriptions   None    PDMP not reviewed this encounter.   Meliton Rattan, [place] 11/29/23 1142

## 2023-11-29 NOTE — ED Triage Notes (Addendum)
Pt presents with complaints of intermittent left-sided abdominal pains x "long time." Pt describes her pain as sharp, radiating to left side of back. Pt currently rates her abdominal pain an 8/10. Denies fevers at home. Pt does report nausea, but denies vomiting. Voices concerns of constipation. Pt states she has taken "stomach pain medication." Last BM was yesterday.

## 2023-11-30 LAB — CBC WITH DIFFERENTIAL/PLATELET
Basophils Absolute: 0 10*3/uL (ref 0.0–0.2)
Basos: 1 %
EOS (ABSOLUTE): 0.1 10*3/uL (ref 0.0–0.4)
Eos: 3 %
Hematocrit: 37.9 % (ref 34.0–46.6)
Hemoglobin: 12.3 g/dL (ref 11.1–15.9)
Immature Grans (Abs): 0 10*3/uL (ref 0.0–0.1)
Immature Granulocytes: 0 %
Lymphocytes Absolute: 1 10*3/uL (ref 0.7–3.1)
Lymphs: 25 %
MCH: 29.8 pg (ref 26.6–33.0)
MCHC: 32.5 g/dL (ref 31.5–35.7)
MCV: 92 fL (ref 79–97)
Monocytes Absolute: 0.3 10*3/uL (ref 0.1–0.9)
Monocytes: 9 %
Neutrophils Absolute: 2.5 10*3/uL (ref 1.4–7.0)
Neutrophils: 62 %
Platelets: 261 10*3/uL (ref 150–450)
RBC: 4.13 x10E6/uL (ref 3.77–5.28)
RDW: 12.6 % (ref 11.7–15.4)
WBC: 4 10*3/uL (ref 3.4–10.8)

## 2023-11-30 LAB — COMPREHENSIVE METABOLIC PANEL
ALT: 20 [IU]/L (ref 0–32)
AST: 22 [IU]/L (ref 0–40)
Albumin: 4 g/dL (ref 3.9–4.9)
Alkaline Phosphatase: 79 [IU]/L (ref 44–121)
BUN/Creatinine Ratio: 12 (ref 9–23)
BUN: 8 mg/dL (ref 6–24)
Bilirubin Total: 0.6 mg/dL (ref 0.0–1.2)
CO2: 24 mmol/L (ref 20–29)
Calcium: 9.1 mg/dL (ref 8.7–10.2)
Chloride: 102 mmol/L (ref 96–106)
Creatinine, Ser: 0.67 mg/dL (ref 0.57–1.00)
Globulin, Total: 3.2 g/dL (ref 1.5–4.5)
Glucose: 85 mg/dL (ref 70–99)
Potassium: 3.5 mmol/L (ref 3.5–5.2)
Sodium: 138 mmol/L (ref 134–144)
Total Protein: 7.2 g/dL (ref 6.0–8.5)
eGFR: 106 mL/min/{1.73_m2} (ref >59)

## 2023-11-30 LAB — LIPASE: Lipase: 15 U/L (ref 14–72)

## 2023-12-06 ENCOUNTER — Ambulatory Visit: Payer: Managed Care, Other (non HMO) | Admitting: Student

## 2023-12-06 ENCOUNTER — Encounter: Payer: Self-pay | Admitting: Student

## 2023-12-06 ENCOUNTER — Ambulatory Visit (HOSPITAL_COMMUNITY)
Admission: RE | Admit: 2023-12-06 | Discharge: 2023-12-06 | Disposition: A | Discharge status: Home / Self Care | Payer: Managed Care, Other (non HMO) | Source: Ambulatory Visit | Attending: Family Medicine | Admitting: Family Medicine

## 2023-12-06 VITALS — BP 116/74 | HR 66 | Ht 63.0 in | Wt 116.8 lb

## 2023-12-06 DIAGNOSIS — Z13220 Encounter for screening for lipoid disorders: Secondary | ICD-10-CM | POA: Diagnosis not present

## 2023-12-06 DIAGNOSIS — Z1231 Encounter for screening mammogram for malignant neoplasm of breast: Secondary | ICD-10-CM

## 2023-12-06 DIAGNOSIS — Z1211 Encounter for screening for malignant neoplasm of colon: Secondary | ICD-10-CM | POA: Diagnosis not present

## 2023-12-06 DIAGNOSIS — R0989 Other specified symptoms and signs involving the circulatory and respiratory systems: Secondary | ICD-10-CM

## 2023-12-06 DIAGNOSIS — R1013 Epigastric pain: Secondary | ICD-10-CM

## 2023-12-06 DIAGNOSIS — Z1159 Encounter for screening for other viral diseases: Secondary | ICD-10-CM

## 2023-12-06 MED ORDER — PANTOPRAZOLE SODIUM 40 MG PO TBEC: 40.0000 mg | DELAYED_RELEASE_TABLET | Freq: Every day | ORAL | 0 refills | Status: AC

## 2023-12-06 NOTE — Patient Instructions (Addendum)
Today we discussed getting an ultrasound of your abdomen to make sure the blood vessel in your stomach has not weakened.  We are scheduling this ultrasound this week.  Please be on the look out for a call from our office whose number is [phone].   If you experience any worsening abdominal pain or back pain that is severe you need to go immediately to the [facility] emergency room to be evaluated.  Do not wait as this can be life-threatening.  I am giving you a medication called Protonix which is an acid reducer to see if this improves your abdominal pain while we wait on the results of your ultrasound.  I have scheduled an appointment in February to do a Pap smear which is a screening tool for cervical cancer.  You may also receive a call from the breast center to receive a mammogram to screen for breast cancer.  You may also receive a call from the gastroenterology office to schedule an appointment for a colonoscopy to screen for colon cancer.   Hm nay chng ta ? th?o lu?n v? vi?c siu m b?ng ?? ??m b?o m?ch mu trong d? dy c?a b?n khng b? suy y?u.  Chng ti ?ang ln l?ch siu m trong tu?n ny.  Vui lng ch? cu?c g?i t? v?n phng c?a chng ti c s? [phone].   N?u b?n b? ?au b?ng ho?c ?au l?ng tr?m tr?ng h?n, b?n c?n ??n ngay phng c?p c?u [place] ?? ???c ?nh gi.  ??ng ch? ??i v ?i?u ny c th? ?e d?a tnh m?ng.  Ti s? ??a cho b?n m?t lo?i thu?c tn l Protonix, m?t lo?i thu?c gi?m axit ?? xem li?u lo?i thu?c ny c c?i thi?n c?n ?au b?ng c?a b?n trong khi chng ti ch? k?t qu? siu m c?a b?n hay khng.  Ti ? ln l?ch h?n vo thng 2 ?? lm xt nghi?m ph?t t? bo Pap, m?t cng c? sng l?c ung th? c? t? cung.  B?n c?ng c th? nh?n ???c cu?c g?i t? trung tm v ?? yu c?u ch?p quang tuy?n v nh?m sng l?c ung th? v.  B?n c?ng c th? nh?n ???c cu?c g?i t? v?n phng khoa tiu ha ?? ??t l?ch h?n n?i soi ?? sng l?c ung th? ru?t k?t.  Future Appointments  Date Time Provider  Department Center  12/13/2023  9:50 AM Glendale Chard, DO [facility]    Please arrive 15 minutes before your appointment to ensure smooth check in process.    Please call the clinic at [phone] if your symptoms worsen or you have any concerns.  Thank you for allowing me to participate in your care, Dr. Glendale Chard [facility]

## 2023-12-06 NOTE — Progress Notes (Signed)
    SUBJECTIVE:   CHIEF COMPLAINT / HPI:   Erin [facility]ley is a 53 y.o. female  presenting to establish care as a new patient.  HPI obtained via in person interpreter.  She reports she has been previously healthy and is only had medical care with her pregnancies.  She is a mother of 3 with her last child born in 2017.  She denies having any prior surgeries or medical complications.  Abdominal pain: Present on and off for over a year.  She reports she is increased pain with certain spicy foods.  Pain radiates to her back and is located in the midline throughout the abdomen.  She reports she can feel a pulse when she touches her stomach.  She does not have a family history of sudden death.  She is never been a smoker.  Denies chest pain, palpitations, shortness of breath.  PERTINENT  PMH / PSH: Reviewed and updated   OBJECTIVE:   BP 116/74   Pulse 66   Ht 5\' 3"  (1.6 m)   Wt 116 lb 12.8 oz (53 kg)   LMP 09/24/2023 (Exact Date)   SpO2 100%   BMI 20.69 kg/m   Well-appearing, no acute distress Cardio: Regular rate, regular rhythm, no murmurs on exam. Pulm: Clear, no wheezing, no crackles. No increased work of breathing Abdominal: bowel sounds present, soft, tender along the epigastric, umbilicus and suprapubic zones, abdominal bruit palpable and auscultated on exam Extremities: no peripheral edema  Neuro: alert and oriented x3, speech normal in content, no facial asymmetry, strength intact and equal bilaterally in UE and LE, pupils equal and reactive to light.    ASSESSMENT/PLAN:   Abdominal bruit Exam and history concerning for AAA.  Will schedule a stat abdominal ultrasound for evaluation.  Discussed emergency room precautions for increased abdominal or back pain.  Cell phone confirmed with patient.  Differential could also include acid reflux, choledocholithiasis, less likely to be infection as patient is afebrile with CBC checked 1 week ago WNL.  Possible pancreatitis however lipase was  checked and was within normal limits at recent ER visit. At patient request prescribed prescription of Protonix 40 mg daily.  30-day supply provided.   Health maintenance: Hep C screening ordered today Colon cancer screening referral sent to GI for colonoscopy Patient scheduled for Pap smear in February Mammogram ordered today Patient declined flu vaccine and COVID-vaccine Discussed Shingrix and patient will consider  Glendale Chard, DO [facility]

## 2023-12-06 NOTE — Assessment & Plan Note (Signed)
Exam and history concerning for AAA.  Will schedule a stat abdominal ultrasound for evaluation.  Discussed emergency room precautions for increased abdominal or back pain.  Cell phone confirmed with patient.  Differential could also include acid reflux, choledocholithiasis, less likely to be infection as patient is afebrile with CBC checked 1 week ago WNL.  Possible pancreatitis however lipase was checked and was within normal limits at recent ER visit. At patient request prescribed prescription of Protonix 40 mg daily.  30-day supply provided.

## 2023-12-07 LAB — LIPID PANEL
Chol/HDL Ratio: 2.3 {ratio} (ref 0.0–4.4)
Cholesterol, Total: 211 mg/dL — ABNORMAL HIGH (ref 100–199)
HDL: 90 mg/dL (ref >39)
LDL Chol Calc (NIH): 111 mg/dL — ABNORMAL HIGH (ref 0–99)
Triglycerides: 54 mg/dL (ref 0–149)
VLDL Cholesterol Cal: 10 mg/dL (ref 5–40)

## 2023-12-07 LAB — HCV INTERPRETATION

## 2023-12-07 LAB — HCV AB W REFLEX TO QUANT PCR: HCV Ab: NONREACTIVE

## 2023-12-08 ENCOUNTER — Encounter: Payer: Self-pay | Admitting: Student

## 2023-12-08 NOTE — Progress Notes (Unsigned)
Result letter routed to staff to mail to patient.   Glendale Chard, DO [facility], PGY-2 12/08/23 4:31 PM

## 2023-12-13 ENCOUNTER — Ambulatory Visit: Payer: Self-pay | Admitting: Student

## 2024-01-20 ENCOUNTER — Encounter: Payer: Self-pay | Admitting: Family Medicine

## 2024-08-09 ENCOUNTER — Telehealth (HOSPITAL_COMMUNITY): Payer: Self-pay

## 2024-08-09 NOTE — Telephone Encounter (Signed)
 Pt's chart was accessed due to pt being on backlogged list of patients in need of PCP placement assistance. Per chart review, pt has since been seen by PCP. No further action taken at this time.
